# Patient Record
Sex: Female | Born: 1935 | Race: White | Hispanic: No | Marital: Married
Health system: Southern US, Community
[De-identification: ages and names within clinical notes are randomized; demographics above are authoritative.]

## PROBLEM LIST (undated history)

## (undated) DIAGNOSIS — I10 Essential (primary) hypertension: Secondary | ICD-10-CM

---

## 1998-10-09 ENCOUNTER — Ambulatory Visit (HOSPITAL_COMMUNITY): Admission: RE | Admit: 1998-10-09 | Discharge: 1998-10-09 | Payer: Self-pay | Admitting: Gastroenterology

## 2003-10-23 ENCOUNTER — Encounter: Admission: RE | Admit: 2003-10-23 | Discharge: 2003-10-23 | Payer: Self-pay | Admitting: Family Medicine

## 2004-01-11 ENCOUNTER — Encounter: Admission: RE | Admit: 2004-01-11 | Discharge: 2004-01-11 | Payer: Self-pay | Admitting: Family Medicine

## 2004-02-22 ENCOUNTER — Ambulatory Visit (HOSPITAL_COMMUNITY): Admission: RE | Admit: 2004-02-22 | Discharge: 2004-02-22 | Payer: Self-pay | Admitting: Gastroenterology

## 2004-09-17 ENCOUNTER — Encounter: Admission: RE | Admit: 2004-09-17 | Discharge: 2004-09-17 | Payer: Self-pay | Admitting: Family Medicine

## 2004-09-18 ENCOUNTER — Encounter: Admission: RE | Admit: 2004-09-18 | Discharge: 2004-09-18 | Payer: Self-pay | Admitting: Family Medicine

## 2005-10-01 ENCOUNTER — Emergency Department: Payer: Self-pay | Admitting: Emergency Medicine

## 2006-08-14 ENCOUNTER — Encounter: Admission: RE | Admit: 2006-08-14 | Discharge: 2006-08-14 | Payer: Self-pay | Admitting: Family Medicine

## 2007-05-17 ENCOUNTER — Encounter: Admission: RE | Admit: 2007-05-17 | Discharge: 2007-05-17 | Payer: Self-pay | Admitting: Family Medicine

## 2007-11-09 ENCOUNTER — Encounter: Admission: RE | Admit: 2007-11-09 | Discharge: 2007-11-09 | Payer: Self-pay | Admitting: Urology

## 2007-11-11 ENCOUNTER — Ambulatory Visit (HOSPITAL_BASED_OUTPATIENT_CLINIC_OR_DEPARTMENT_OTHER): Admission: RE | Admit: 2007-11-11 | Discharge: 2007-11-11 | Payer: Self-pay | Admitting: Urology

## 2008-08-29 ENCOUNTER — Ambulatory Visit (HOSPITAL_BASED_OUTPATIENT_CLINIC_OR_DEPARTMENT_OTHER): Admission: RE | Admit: 2008-08-29 | Discharge: 2008-08-29 | Payer: Self-pay | Admitting: Urology

## 2010-06-10 ENCOUNTER — Emergency Department (HOSPITAL_COMMUNITY)
Admission: EM | Admit: 2010-06-10 | Discharge: 2010-06-10 | Payer: Self-pay | Source: Home / Self Care | Admitting: Emergency Medicine

## 2010-09-26 LAB — BASIC METABOLIC PANEL
CO2: 25 mEq/L (ref 19–32)
Calcium: 9.1 mg/dL (ref 8.4–10.5)
Chloride: 105 mEq/L (ref 96–112)
Glucose, Bld: 108 mg/dL — ABNORMAL HIGH (ref 70–99)
Sodium: 140 mEq/L (ref 135–145)

## 2010-09-26 LAB — ABO/RH: ABO/RH(D): O POS

## 2010-09-26 LAB — CBC
Hemoglobin: 13 g/dL (ref 12.0–15.0)
MCHC: 34.2 g/dL (ref 30.0–36.0)
MCV: 96.1 fL (ref 78.0–100.0)

## 2010-09-26 LAB — APTT: aPTT: 26 seconds (ref 24–37)

## 2010-10-29 NOTE — Op Note (Signed)
Cynthia Tanner, KOFOED NO.:  [id number]   MEDICAL RECORD NO.:  [id number]          PATIENT TYPE:  AMB   LOCATION:  [facility]                         FACILITY:  [facility]   PHYSICIAN:  Martina Sinner, MD DATE OF BIRTH:  [birth year]   DATE OF PROCEDURE:  08/29/2008  DATE OF DISCHARGE:                               OPERATIVE REPORT   PREOPERATIVE DIAGNOSIS:  Stress urinary incontinence.   POSTOPERATIVE DIAGNOSIS:  Stress urinary incontinence.   SURGERY:  Sling cystourethropexy.   [name] has mixed incontinence but also stress incontinence.  She was  dry after 1 collagen.  Her symptoms recurred and she wished to have a  sling.   The patient was prepped and draped in the usual fashion.  She was given  preoperative antibiotics.  Preoperative laboratory test were normal.  Leg positioning was good to minimize the risk of compartment syndrome,  neuropathy and DVT.   I made two 1-cm incisions 1 fingerbreadth above the symphysis pubis 1.5  cm lateral to the midline.  I marked a 2-cm incision underlying the mid  urethra and instilled 7 mL of lidocaine/epinephrine mixture.  I made an  appropriately deep incision and dissected sharply to the urethrovesical  angle bilaterally.  She had healthy urethra palpably and visibly.   With the bladder empty, I passed a SPARK needle on top, along the back  of the symphysis pubis parallel to the midline and on the pulp of my  index finger bilaterally.   I cystoscoped the patient.  There was no indentation of the bladder with  deflection of the trocar.  There was no injury to the bladder or ureter  or urethra.  There were good blue jets bilaterally.   With the bladder emptied, I attached the SPARK sling and brought it up  to the retropubic space.  I tensioned over the fat part of a moderate-  sized Kelly clamp.  I cut below the blue dots and removed the sheath.  There was appropriate hypermobility and lack of spring-back effect.  I  was happy with the tension position of the sling.   After irrigation, I closed the anterior vaginal wall incision with  running 2-0 Vicryl, followed by 2 interrupted sutures.  I cut the sling  below the skin level and closed with one 4-0 Vicryl, followed by  Dermabond.   She was oozing some during the case.  Blood loss was less than 100 mL.  A firm vaginal pack was inserted with Estrace cream.  Hopefully this  procedure will reach [name]'s treatment goal.           ______________________________  Martina Sinner, MD  Electronically Signed     SAM/MEDQ  D:  08/29/2008  T:  08/29/2008  Job:  [id number]

## 2010-10-29 NOTE — Op Note (Signed)
Cynthia Tanner, DEGRAFFENREID NO.:  [id number]   MEDICAL RECORD NO.:  [id number]          PATIENT TYPE:  AMB   LOCATION:  [facility]                         FACILITY:  [facility]   PHYSICIAN:  Martina Sinner, MD DATE OF BIRTH:  [birth year]   DATE OF PROCEDURE:  11/11/2007  DATE OF DISCHARGE:                               OPERATIVE REPORT   PREOPERATIVE DIAGNOSES:  Stress urinary continence.   POSTOPERATIVE DIAGNOSES:  Stress urinary continence.   SURGERY:  Transurethral cystoscopy and transurethral collagen injection  therapy on Nov 11, 2007.   [name] has mixed stress urge incontinence.  She is 50% better or  greater with Enablex.  Gala Murdoch did not help any more, so she went back to  Enablex.  She was a bit frightened to have surgery and chose collagen  over a sling.   DESCRIPTION OF PROCEDURE:  The patient was prepped and draped in the  usual fashion.  The ACMI injection scope was utilized for the  examination.  Bladder mucosa and trigone were normal.  There is no  stitch, foreign body or carcinoma.  She had hypermobility of the bladder  neck.  Surprisingly, her urethra was somewhat short, approximately 2-2.5  cm in length, making the injection a little bit more challenging.  At 5  o'clock, there is a little bit of a perforation into the neck of the  bladder.  I had a very good coaptation at 7 o'clock.  I used a total of  five syringes.  I put a fifth syringe of collagen near 12 o'clock to try  to make the sphincter even tighter, recognizing the 5 o'clock injection  was not as successful as I would like.  She did have some extravasation  of collagen from the 7 o'clock injection site since it was difficult to  tunnel the needle for as long of a distance as I would like.   She has multiple allergies.  I send her home with Vicodin and  ciprofloxacin.  Based upon the findings today and depending upon her  outcome, it may be more favorable to proceed with a sling in the  future.  He is going to stay off Enablex initially to see how the collagen works.  She will go back on Enablex if she urgency, urge incontinence and  frequency.           ______________________________  Martina Sinner, MD  Electronically Signed     SAM/MEDQ  D:  11/11/2007  T:  11/11/2007  Job:  [id number]

## 2010-11-01 NOTE — Op Note (Signed)
NAMEKYISHA, FOWLE                          ACCOUNT NO.:  [id number]   MEDICAL RECORD NO.:  [id number]                   PATIENT TYPE:  AMB   LOCATION:  ENDO                                 FACILITY:  [facility]   PHYSICIAN:  James L. Malon Kindle., M.D.          DATE OF BIRTH:  December 09, 1934   DATE OF PROCEDURE:  02/22/2004  DATE OF DISCHARGE:                                 OPERATIVE REPORT   PROCEDURE PERFORMED:  Colonoscopy.   ENDOSCOPIST:  Llana Aliment. Edwards, M.D.   MEDICATIONS:  Fentanyl 70 mcg, Versed 7 mg IV.   INDICATIONS FOR PROCEDURE:  Rectal bleeding.   DESCRIPTION OF PROCEDURE:  The procedure had been explained to the patient  and consent obtained.  With the patient in the left lateral decubitus  position, the pediatric adjustable Olympus colonoscope was inserted and  advanced.  The prep was excellent.  We were able to reach the cecum without  difficulty.  The ileocecal valve and appendiceal orifice were seen.  The  scope was withdrawn and the cecum, ascending colon, transverse colon,  splenic flexure, descending and sigmoid colon were seen well.  Scattered  diverticulosis in the sigmoid colon, no other abnormalities.  The rectum was  free of polyps.  In the retroflex view, the patient was seen to have  internal hemorrhoids of a moderate nature.  The scope was withdrawn.  The  patient tolerated the procedure well.   ASSESSMENT:  1.  Rectal bleeding, probably internal hemorrhoids, 5781.  2.  Moderate diverticulosis, 56210.   PLAN:  Will recommend yearly Hemoccults, will give a hemorrhoid instruction  sheet and see her back on an as needed basis.                                               James L. Malon Kindle., M.D.    Waldron Session  D:  02/22/2004  T:  02/22/2004  Job:  [id number]   cc:   Donia Guiles, M.D.  [address] [place]  [place] [address]  Fax: [phone]

## 2011-03-12 LAB — POCT I-STAT 4, (NA,K, GLUC, HGB,HCT)
Glucose, Bld: 92
HCT: 39
Hemoglobin: 13.3
Operator id: 280881
Potassium: 4.8

## 2012-06-21 ENCOUNTER — Other Ambulatory Visit: Payer: Self-pay | Admitting: Family Medicine

## 2012-06-21 DIAGNOSIS — M25511 Pain in right shoulder: Secondary | ICD-10-CM

## 2012-06-25 ENCOUNTER — Ambulatory Visit
Admission: RE | Admit: 2012-06-25 | Discharge: 2012-06-25 | Disposition: A | Discharge status: Home / Self Care | Payer: Medicare Other | Source: Ambulatory Visit | Attending: Family Medicine | Admitting: Family Medicine

## 2012-06-25 DIAGNOSIS — M25511 Pain in right shoulder: Secondary | ICD-10-CM

## 2013-01-23 ENCOUNTER — Emergency Department: Payer: Self-pay | Admitting: Emergency Medicine

## 2013-02-11 ENCOUNTER — Emergency Department: Payer: Self-pay | Admitting: Emergency Medicine

## 2014-09-14 ENCOUNTER — Other Ambulatory Visit: Payer: Self-pay | Admitting: Family Medicine

## 2014-09-14 DIAGNOSIS — R109 Unspecified abdominal pain: Secondary | ICD-10-CM

## 2014-09-21 ENCOUNTER — Ambulatory Visit
Admission: RE | Admit: 2014-09-21 | Discharge: 2014-09-21 | Disposition: A | Discharge status: Home / Self Care | Payer: Medicare Other | Source: Ambulatory Visit | Attending: Family Medicine | Admitting: Family Medicine

## 2014-09-21 DIAGNOSIS — R109 Unspecified abdominal pain: Secondary | ICD-10-CM

## 2015-06-19 DIAGNOSIS — M5432 Sciatica, left side: Secondary | ICD-10-CM | POA: Diagnosis not present

## 2015-06-19 DIAGNOSIS — M9903 Segmental and somatic dysfunction of lumbar region: Secondary | ICD-10-CM | POA: Diagnosis not present

## 2015-07-02 DIAGNOSIS — Z1231 Encounter for screening mammogram for malignant neoplasm of breast: Secondary | ICD-10-CM | POA: Diagnosis not present

## 2016-01-11 DIAGNOSIS — F411 Generalized anxiety disorder: Secondary | ICD-10-CM | POA: Diagnosis not present

## 2016-01-11 DIAGNOSIS — I10 Essential (primary) hypertension: Secondary | ICD-10-CM | POA: Diagnosis not present

## 2016-01-11 DIAGNOSIS — K219 Gastro-esophageal reflux disease without esophagitis: Secondary | ICD-10-CM | POA: Diagnosis not present

## 2016-01-11 DIAGNOSIS — Z0000 Encounter for general adult medical examination without abnormal findings: Secondary | ICD-10-CM | POA: Diagnosis not present

## 2016-01-11 DIAGNOSIS — M1990 Unspecified osteoarthritis, unspecified site: Secondary | ICD-10-CM | POA: Diagnosis not present

## 2016-01-11 DIAGNOSIS — J301 Allergic rhinitis due to pollen: Secondary | ICD-10-CM | POA: Diagnosis not present

## 2016-01-11 DIAGNOSIS — M8580 Other specified disorders of bone density and structure, unspecified site: Secondary | ICD-10-CM | POA: Diagnosis not present

## 2016-01-11 DIAGNOSIS — E669 Obesity, unspecified: Secondary | ICD-10-CM | POA: Diagnosis not present

## 2016-01-11 DIAGNOSIS — R7309 Other abnormal glucose: Secondary | ICD-10-CM | POA: Diagnosis not present

## 2016-01-11 DIAGNOSIS — E782 Mixed hyperlipidemia: Secondary | ICD-10-CM | POA: Diagnosis not present

## 2016-04-01 DIAGNOSIS — H04123 Dry eye syndrome of bilateral lacrimal glands: Secondary | ICD-10-CM | POA: Diagnosis not present

## 2016-04-01 DIAGNOSIS — Z961 Presence of intraocular lens: Secondary | ICD-10-CM | POA: Diagnosis not present

## 2016-04-01 DIAGNOSIS — H278 Other specified disorders of lens: Secondary | ICD-10-CM | POA: Diagnosis not present

## 2016-04-01 DIAGNOSIS — H26493 Other secondary cataract, bilateral: Secondary | ICD-10-CM | POA: Diagnosis not present

## 2016-04-10 DIAGNOSIS — H26493 Other secondary cataract, bilateral: Secondary | ICD-10-CM | POA: Diagnosis not present

## 2016-04-17 DIAGNOSIS — Z961 Presence of intraocular lens: Secondary | ICD-10-CM | POA: Diagnosis not present

## 2016-04-17 DIAGNOSIS — H26491 Other secondary cataract, right eye: Secondary | ICD-10-CM | POA: Diagnosis not present

## 2016-05-14 DIAGNOSIS — M9903 Segmental and somatic dysfunction of lumbar region: Secondary | ICD-10-CM | POA: Diagnosis not present

## 2016-05-14 DIAGNOSIS — M5441 Lumbago with sciatica, right side: Secondary | ICD-10-CM | POA: Diagnosis not present

## 2016-05-26 DIAGNOSIS — M5441 Lumbago with sciatica, right side: Secondary | ICD-10-CM | POA: Diagnosis not present

## 2016-05-26 DIAGNOSIS — M9903 Segmental and somatic dysfunction of lumbar region: Secondary | ICD-10-CM | POA: Diagnosis not present

## 2016-06-25 DIAGNOSIS — M9903 Segmental and somatic dysfunction of lumbar region: Secondary | ICD-10-CM | POA: Diagnosis not present

## 2016-06-25 DIAGNOSIS — M5441 Lumbago with sciatica, right side: Secondary | ICD-10-CM | POA: Diagnosis not present

## 2016-06-25 DIAGNOSIS — M7611 Psoas tendinitis, right hip: Secondary | ICD-10-CM | POA: Diagnosis not present

## 2016-06-30 DIAGNOSIS — M7611 Psoas tendinitis, right hip: Secondary | ICD-10-CM | POA: Diagnosis not present

## 2016-06-30 DIAGNOSIS — M9903 Segmental and somatic dysfunction of lumbar region: Secondary | ICD-10-CM | POA: Diagnosis not present

## 2016-06-30 DIAGNOSIS — M5441 Lumbago with sciatica, right side: Secondary | ICD-10-CM | POA: Diagnosis not present

## 2016-07-09 DIAGNOSIS — Z1231 Encounter for screening mammogram for malignant neoplasm of breast: Secondary | ICD-10-CM | POA: Diagnosis not present

## 2016-07-09 DIAGNOSIS — N760 Acute vaginitis: Secondary | ICD-10-CM | POA: Diagnosis not present

## 2016-07-11 DIAGNOSIS — R922 Inconclusive mammogram: Secondary | ICD-10-CM | POA: Diagnosis not present

## 2016-07-14 DIAGNOSIS — I10 Essential (primary) hypertension: Secondary | ICD-10-CM | POA: Diagnosis not present

## 2016-07-14 DIAGNOSIS — E782 Mixed hyperlipidemia: Secondary | ICD-10-CM | POA: Diagnosis not present

## 2016-07-14 DIAGNOSIS — E669 Obesity, unspecified: Secondary | ICD-10-CM | POA: Diagnosis not present

## 2016-07-14 DIAGNOSIS — R7301 Impaired fasting glucose: Secondary | ICD-10-CM | POA: Diagnosis not present

## 2016-07-14 DIAGNOSIS — Z6832 Body mass index (BMI) 32.0-32.9, adult: Secondary | ICD-10-CM | POA: Diagnosis not present

## 2016-07-14 DIAGNOSIS — N760 Acute vaginitis: Secondary | ICD-10-CM | POA: Diagnosis not present

## 2016-08-26 DIAGNOSIS — N760 Acute vaginitis: Secondary | ICD-10-CM | POA: Diagnosis not present

## 2016-09-19 DIAGNOSIS — N760 Acute vaginitis: Secondary | ICD-10-CM | POA: Diagnosis not present

## 2016-09-26 DIAGNOSIS — N761 Subacute and chronic vaginitis: Secondary | ICD-10-CM | POA: Diagnosis not present

## 2016-12-12 DIAGNOSIS — M5441 Lumbago with sciatica, right side: Secondary | ICD-10-CM | POA: Diagnosis not present

## 2016-12-12 DIAGNOSIS — M9903 Segmental and somatic dysfunction of lumbar region: Secondary | ICD-10-CM | POA: Diagnosis not present

## 2017-02-03 DIAGNOSIS — Z0000 Encounter for general adult medical examination without abnormal findings: Secondary | ICD-10-CM | POA: Diagnosis not present

## 2017-02-03 DIAGNOSIS — M8580 Other specified disorders of bone density and structure, unspecified site: Secondary | ICD-10-CM | POA: Diagnosis not present

## 2017-02-03 DIAGNOSIS — R5383 Other fatigue: Secondary | ICD-10-CM | POA: Diagnosis not present

## 2017-02-03 DIAGNOSIS — E669 Obesity, unspecified: Secondary | ICD-10-CM | POA: Diagnosis not present

## 2017-02-03 DIAGNOSIS — E782 Mixed hyperlipidemia: Secondary | ICD-10-CM | POA: Diagnosis not present

## 2017-02-03 DIAGNOSIS — J301 Allergic rhinitis due to pollen: Secondary | ICD-10-CM | POA: Diagnosis not present

## 2017-02-03 DIAGNOSIS — I10 Essential (primary) hypertension: Secondary | ICD-10-CM | POA: Diagnosis not present

## 2017-02-03 DIAGNOSIS — F411 Generalized anxiety disorder: Secondary | ICD-10-CM | POA: Diagnosis not present

## 2017-02-03 DIAGNOSIS — M1990 Unspecified osteoarthritis, unspecified site: Secondary | ICD-10-CM | POA: Diagnosis not present

## 2017-02-03 DIAGNOSIS — R7309 Other abnormal glucose: Secondary | ICD-10-CM | POA: Diagnosis not present

## 2017-02-03 DIAGNOSIS — K219 Gastro-esophageal reflux disease without esophagitis: Secondary | ICD-10-CM | POA: Diagnosis not present

## 2017-03-17 DIAGNOSIS — M8588 Other specified disorders of bone density and structure, other site: Secondary | ICD-10-CM | POA: Diagnosis not present

## 2017-03-31 DIAGNOSIS — M810 Age-related osteoporosis without current pathological fracture: Secondary | ICD-10-CM | POA: Diagnosis not present

## 2017-07-14 DIAGNOSIS — H04123 Dry eye syndrome of bilateral lacrimal glands: Secondary | ICD-10-CM | POA: Diagnosis not present

## 2017-07-14 DIAGNOSIS — Z961 Presence of intraocular lens: Secondary | ICD-10-CM | POA: Diagnosis not present

## 2017-07-14 DIAGNOSIS — H43813 Vitreous degeneration, bilateral: Secondary | ICD-10-CM | POA: Diagnosis not present

## 2017-07-15 DIAGNOSIS — Z1231 Encounter for screening mammogram for malignant neoplasm of breast: Secondary | ICD-10-CM | POA: Diagnosis not present

## 2017-08-05 DIAGNOSIS — N3281 Overactive bladder: Secondary | ICD-10-CM | POA: Diagnosis not present

## 2017-08-05 DIAGNOSIS — R7309 Other abnormal glucose: Secondary | ICD-10-CM | POA: Diagnosis not present

## 2017-08-05 DIAGNOSIS — Z6834 Body mass index (BMI) 34.0-34.9, adult: Secondary | ICD-10-CM | POA: Diagnosis not present

## 2017-08-05 DIAGNOSIS — E782 Mixed hyperlipidemia: Secondary | ICD-10-CM | POA: Diagnosis not present

## 2017-08-05 DIAGNOSIS — I10 Essential (primary) hypertension: Secondary | ICD-10-CM | POA: Diagnosis not present

## 2017-08-05 DIAGNOSIS — E669 Obesity, unspecified: Secondary | ICD-10-CM | POA: Diagnosis not present

## 2017-10-08 DIAGNOSIS — M810 Age-related osteoporosis without current pathological fracture: Secondary | ICD-10-CM | POA: Diagnosis not present

## 2017-12-14 DIAGNOSIS — K131 Cheek and lip biting: Secondary | ICD-10-CM | POA: Diagnosis not present

## 2017-12-31 DIAGNOSIS — N3946 Mixed incontinence: Secondary | ICD-10-CM | POA: Diagnosis not present

## 2017-12-31 DIAGNOSIS — F980 Enuresis not due to a substance or known physiological condition: Secondary | ICD-10-CM | POA: Diagnosis not present

## 2018-01-06 DIAGNOSIS — N3946 Mixed incontinence: Secondary | ICD-10-CM | POA: Diagnosis not present

## 2018-01-06 DIAGNOSIS — R351 Nocturia: Secondary | ICD-10-CM | POA: Diagnosis not present

## 2018-02-04 DIAGNOSIS — F980 Enuresis not due to a substance or known physiological condition: Secondary | ICD-10-CM | POA: Diagnosis not present

## 2018-02-04 DIAGNOSIS — N3946 Mixed incontinence: Secondary | ICD-10-CM | POA: Diagnosis not present

## 2018-02-10 ENCOUNTER — Other Ambulatory Visit: Payer: Self-pay | Admitting: Family Medicine

## 2018-02-10 DIAGNOSIS — E782 Mixed hyperlipidemia: Secondary | ICD-10-CM | POA: Diagnosis not present

## 2018-02-10 DIAGNOSIS — J301 Allergic rhinitis due to pollen: Secondary | ICD-10-CM | POA: Diagnosis not present

## 2018-02-10 DIAGNOSIS — M810 Age-related osteoporosis without current pathological fracture: Secondary | ICD-10-CM | POA: Diagnosis not present

## 2018-02-10 DIAGNOSIS — I10 Essential (primary) hypertension: Secondary | ICD-10-CM | POA: Diagnosis not present

## 2018-02-10 DIAGNOSIS — F411 Generalized anxiety disorder: Secondary | ICD-10-CM | POA: Diagnosis not present

## 2018-02-10 DIAGNOSIS — Z23 Encounter for immunization: Secondary | ICD-10-CM | POA: Diagnosis not present

## 2018-02-10 DIAGNOSIS — M1990 Unspecified osteoarthritis, unspecified site: Secondary | ICD-10-CM | POA: Diagnosis not present

## 2018-02-10 DIAGNOSIS — R1010 Upper abdominal pain, unspecified: Secondary | ICD-10-CM | POA: Diagnosis not present

## 2018-02-10 DIAGNOSIS — R7309 Other abnormal glucose: Secondary | ICD-10-CM | POA: Diagnosis not present

## 2018-02-10 DIAGNOSIS — K219 Gastro-esophageal reflux disease without esophagitis: Secondary | ICD-10-CM | POA: Diagnosis not present

## 2018-02-10 DIAGNOSIS — Z0000 Encounter for general adult medical examination without abnormal findings: Secondary | ICD-10-CM | POA: Diagnosis not present

## 2018-02-19 ENCOUNTER — Ambulatory Visit
Admission: RE | Admit: 2018-02-19 | Discharge: 2018-02-19 | Disposition: A | Discharge status: Home / Self Care | Payer: PPO | Source: Ambulatory Visit | Attending: Family Medicine | Admitting: Family Medicine

## 2018-02-19 DIAGNOSIS — R1010 Upper abdominal pain, unspecified: Secondary | ICD-10-CM

## 2018-02-19 DIAGNOSIS — D171 Benign lipomatous neoplasm of skin and subcutaneous tissue of trunk: Secondary | ICD-10-CM | POA: Diagnosis not present

## 2018-02-19 DIAGNOSIS — K449 Diaphragmatic hernia without obstruction or gangrene: Secondary | ICD-10-CM | POA: Diagnosis not present

## 2018-02-19 MED ORDER — IOPAMIDOL (ISOVUE-300) INJECTION 61%
100.0000 mL | Freq: Once | INTRAVENOUS | Status: AC | PRN
  Administered 2018-02-19: 100 mL via INTRAVENOUS

## 2018-02-27 DIAGNOSIS — R21 Rash and other nonspecific skin eruption: Secondary | ICD-10-CM | POA: Diagnosis not present

## 2018-03-17 DIAGNOSIS — F980 Enuresis not due to a substance or known physiological condition: Secondary | ICD-10-CM | POA: Diagnosis not present

## 2018-03-17 DIAGNOSIS — N3946 Mixed incontinence: Secondary | ICD-10-CM | POA: Diagnosis not present

## 2018-04-12 DIAGNOSIS — M810 Age-related osteoporosis without current pathological fracture: Secondary | ICD-10-CM | POA: Diagnosis not present

## 2018-04-30 DIAGNOSIS — R05 Cough: Secondary | ICD-10-CM | POA: Diagnosis not present

## 2018-04-30 DIAGNOSIS — K449 Diaphragmatic hernia without obstruction or gangrene: Secondary | ICD-10-CM | POA: Diagnosis not present

## 2018-07-19 DIAGNOSIS — J069 Acute upper respiratory infection, unspecified: Secondary | ICD-10-CM | POA: Diagnosis not present

## 2018-08-18 ENCOUNTER — Ambulatory Visit
Admission: RE | Admit: 2018-08-18 | Discharge: 2018-08-18 | Disposition: A | Discharge status: Home / Self Care | Payer: PPO | Source: Ambulatory Visit | Attending: Family Medicine | Admitting: Family Medicine

## 2018-08-18 ENCOUNTER — Other Ambulatory Visit: Payer: Self-pay | Admitting: Family Medicine

## 2018-08-18 DIAGNOSIS — L309 Dermatitis, unspecified: Secondary | ICD-10-CM | POA: Diagnosis not present

## 2018-08-18 DIAGNOSIS — R059 Cough, unspecified: Secondary | ICD-10-CM

## 2018-08-18 DIAGNOSIS — R05 Cough: Secondary | ICD-10-CM | POA: Diagnosis not present

## 2018-08-18 DIAGNOSIS — R7309 Other abnormal glucose: Secondary | ICD-10-CM | POA: Diagnosis not present

## 2018-08-18 DIAGNOSIS — I10 Essential (primary) hypertension: Secondary | ICD-10-CM | POA: Diagnosis not present

## 2018-08-18 DIAGNOSIS — Z6834 Body mass index (BMI) 34.0-34.9, adult: Secondary | ICD-10-CM | POA: Diagnosis not present

## 2018-08-18 DIAGNOSIS — E669 Obesity, unspecified: Secondary | ICD-10-CM | POA: Diagnosis not present

## 2018-08-18 DIAGNOSIS — N760 Acute vaginitis: Secondary | ICD-10-CM | POA: Diagnosis not present

## 2018-08-18 DIAGNOSIS — R5383 Other fatigue: Secondary | ICD-10-CM | POA: Diagnosis not present

## 2018-08-18 DIAGNOSIS — E782 Mixed hyperlipidemia: Secondary | ICD-10-CM | POA: Diagnosis not present

## 2018-12-01 DIAGNOSIS — L570 Actinic keratosis: Secondary | ICD-10-CM | POA: Diagnosis not present

## 2018-12-01 DIAGNOSIS — L853 Xerosis cutis: Secondary | ICD-10-CM | POA: Diagnosis not present

## 2018-12-01 DIAGNOSIS — D485 Neoplasm of uncertain behavior of skin: Secondary | ICD-10-CM | POA: Diagnosis not present

## 2018-12-01 DIAGNOSIS — C44319 Basal cell carcinoma of skin of other parts of face: Secondary | ICD-10-CM | POA: Diagnosis not present

## 2019-01-04 DIAGNOSIS — Z1231 Encounter for screening mammogram for malignant neoplasm of breast: Secondary | ICD-10-CM | POA: Diagnosis not present

## 2019-01-18 DIAGNOSIS — C44319 Basal cell carcinoma of skin of other parts of face: Secondary | ICD-10-CM | POA: Diagnosis not present

## 2019-01-27 DIAGNOSIS — M810 Age-related osteoporosis without current pathological fracture: Secondary | ICD-10-CM | POA: Diagnosis not present

## 2019-01-28 IMAGING — CT CT ABDOMEN W/ CM
2 of 3 series · 13 of 32 positions shown, 19 images · IV contrast (iopamidol)
Comparison: None.

CLINICAL DATA: Upper abdominal pain for 1 year.

EXAM:
CT ABDOMEN WITH CONTRAST
TECHNIQUE: Multidetector CT imaging of the abdomen was performed using the
standard protocol following bolus administration of intravenous
contrast.
CONTRAST:  100mL 9D7GYK-OTT IOPAMIDOL (9D7GYK-OTT) INJECTION 61%

[Series 2: abdroutine 5.0 i40s 1 · axial · 0.81mm/px · z∈[-305,-80]mm · 9 of 57 slices shown, 15 images]
[im 6/57  soft-tissue]
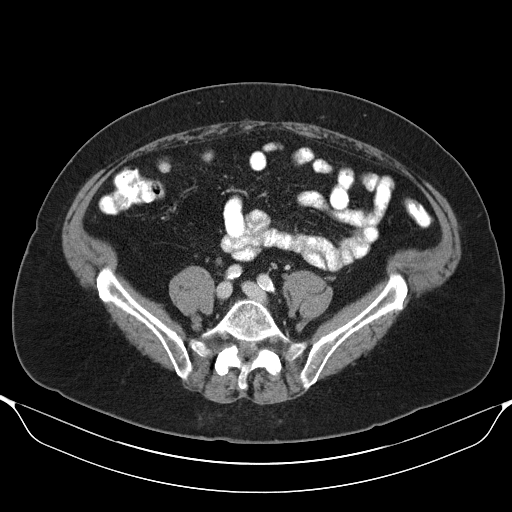
[im 6/57  bone]
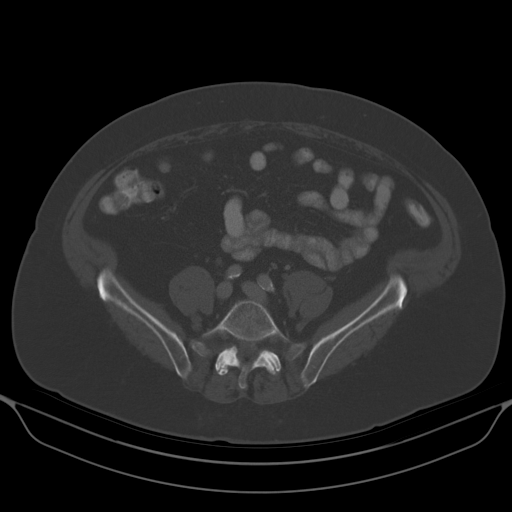
[im 12/57  soft-tissue]
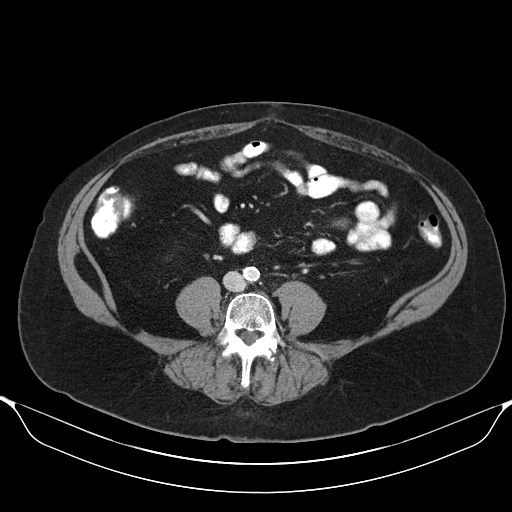
[im 17/57  soft-tissue]
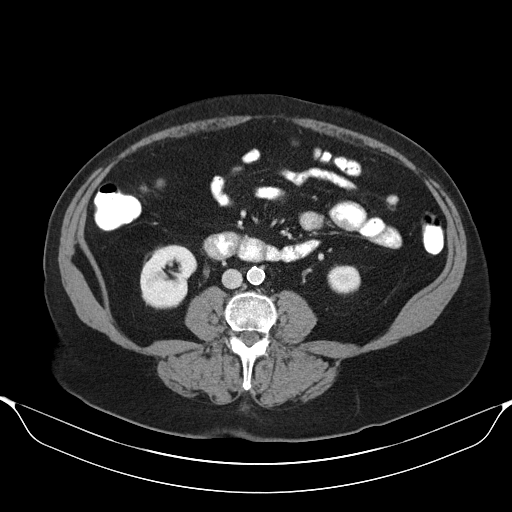
[im 23/57  soft-tissue]
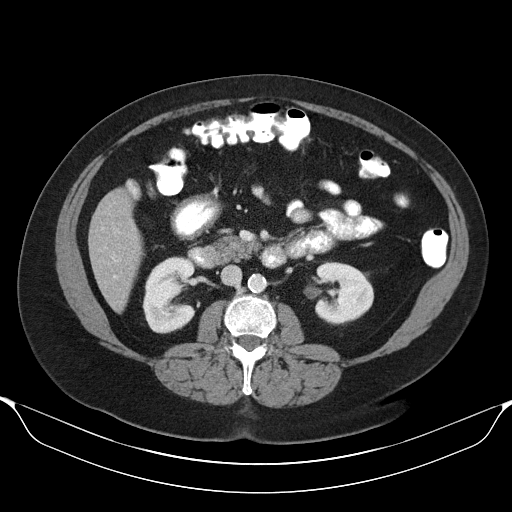
[im 29/57  soft-tissue]
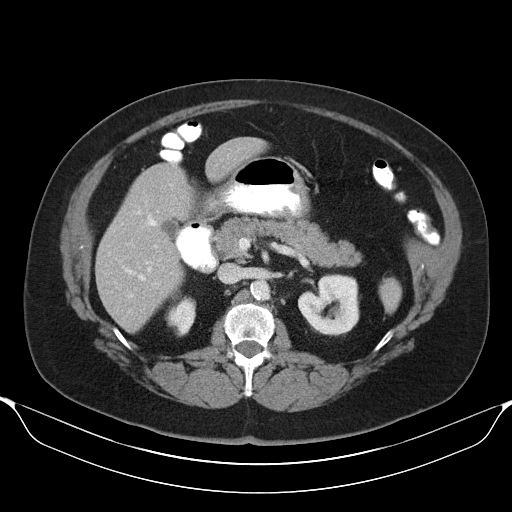
[im 34/57  soft-tissue]
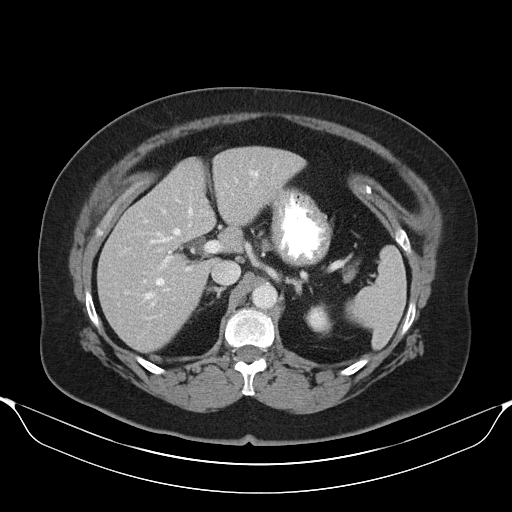
[im 34/57  lung]
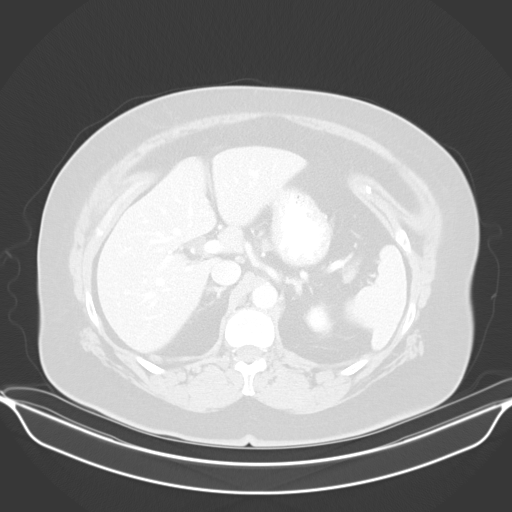
[im 40/57  soft-tissue]
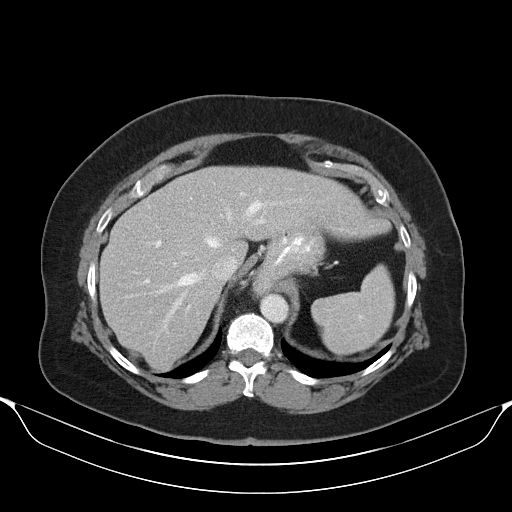
[im 40/57  lung]
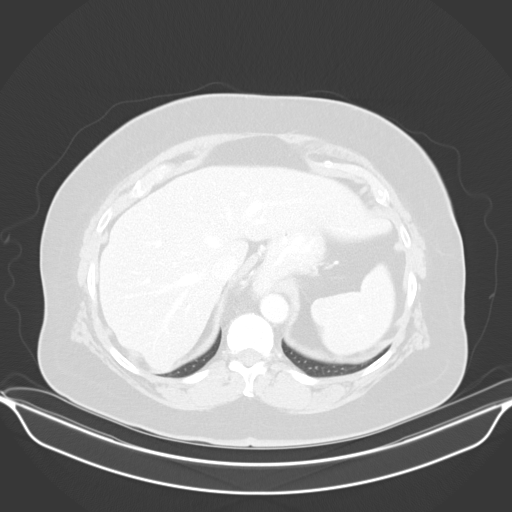
[im 45/57  soft-tissue]
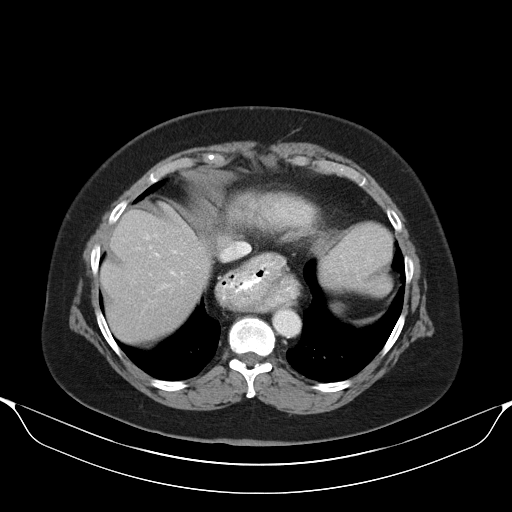
[im 45/57  lung]
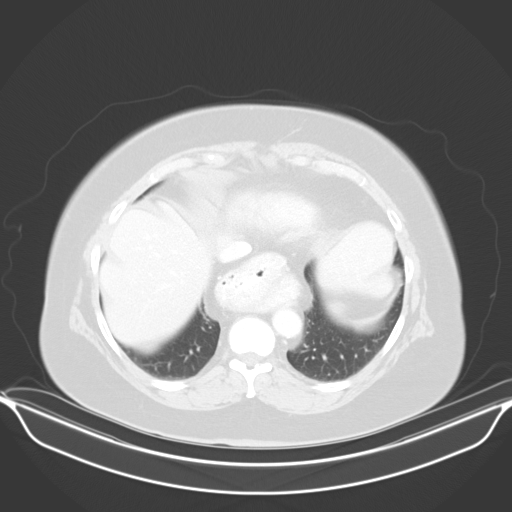
[im 51/57  soft-tissue]
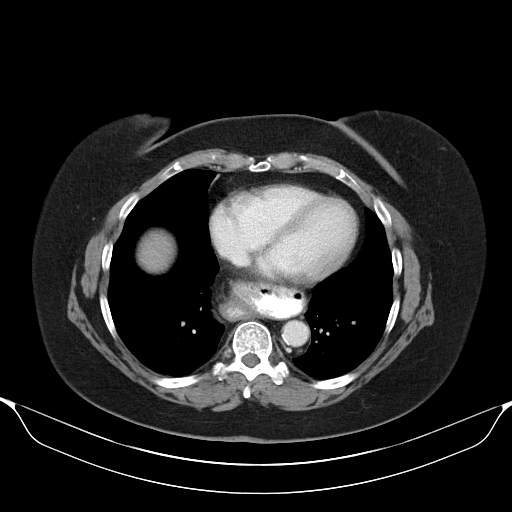
[im 51/57  lung]
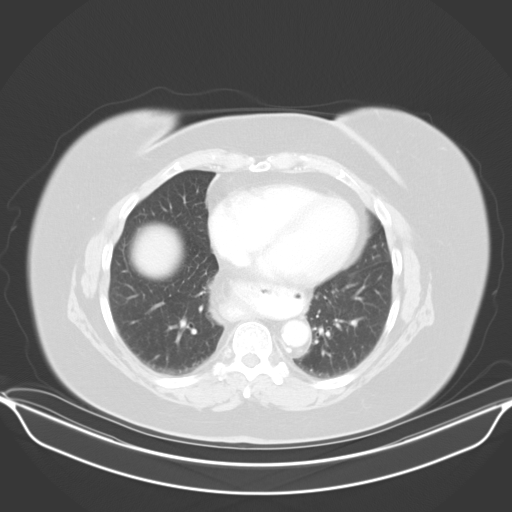
[im 51/57  bone]
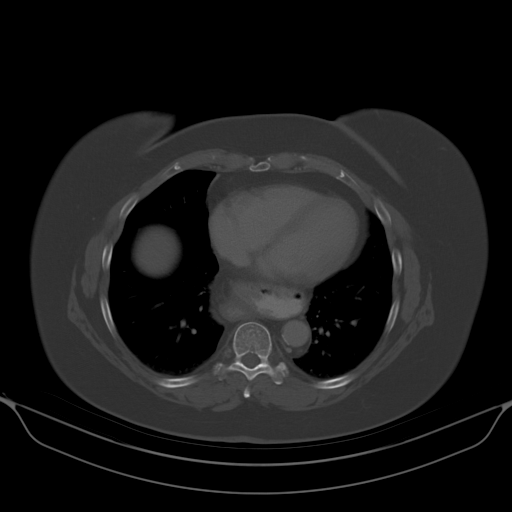

[Series 4: lungs · axial · 0.81mm/px · z∈[-170,-120]mm · 4 of 66 slices shown]
[im 6/66  soft-tissue]
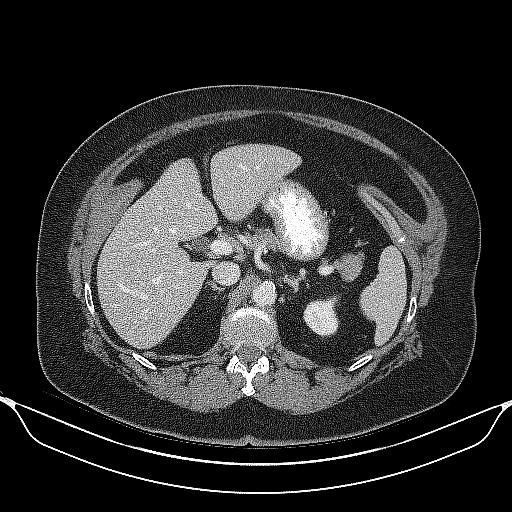
[im 16/66  soft-tissue]
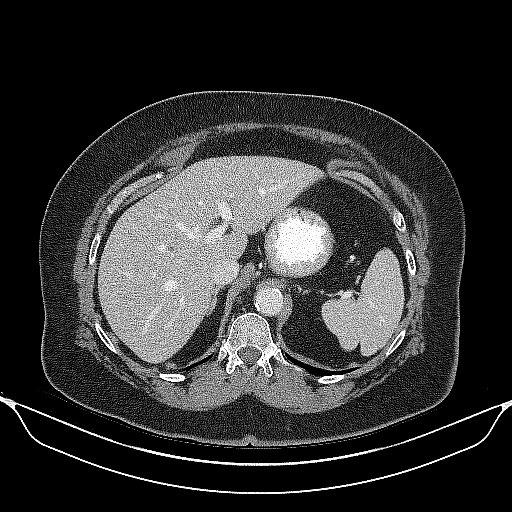
[im 21/66  soft-tissue]
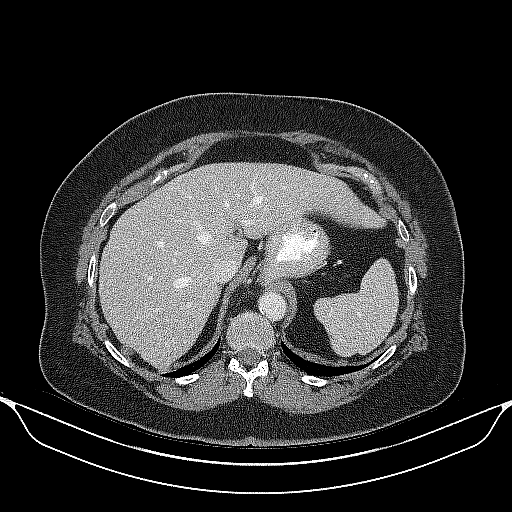
[im 31/66  soft-tissue]
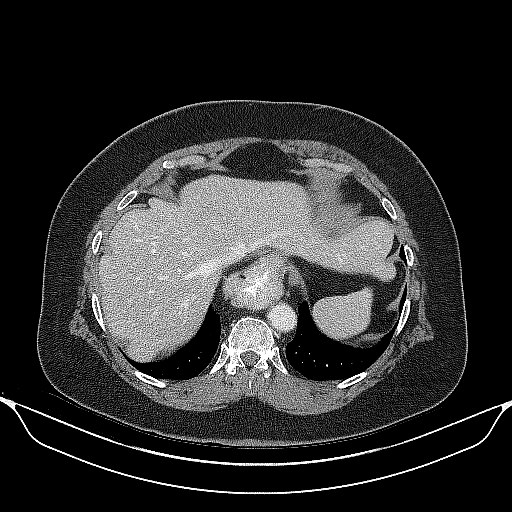

[13 of 32 positions shown; findings below may reference images not displayed]

FINDINGS: Lower chest: No acute findings.

Hepatobiliary: No hepatic masses identified. Gallbladder is
unremarkable.

Pancreas:  No mass or inflammatory changes.

Spleen:  Within normal limits in size and appearance.

Adrenals/Urinary Tract: No masses identified. No evidence of
hydronephrosis.

Stomach/Bowel: Moderate size hiatal hernia is seen. Visualized bowel
loops are unremarkable.

Vascular/Lymphatic: No pathologically enlarged lymph nodes
identified. No abdominal aortic aneurysm. Aortic atherosclerosis.

Other: A small intramuscular lipoma is seen in the right lateral
abdominal wall, which measures 6.6 x 1.8 cm.

Musculoskeletal:  No suspicious bone lesions identified.
IMPRESSION: No acute findings.

Moderate hiatal hernia.

Small right lateral abdominal wall intramuscular lipoma.

## 2019-02-16 DIAGNOSIS — R7309 Other abnormal glucose: Secondary | ICD-10-CM | POA: Diagnosis not present

## 2019-02-16 DIAGNOSIS — I10 Essential (primary) hypertension: Secondary | ICD-10-CM | POA: Diagnosis not present

## 2019-02-16 DIAGNOSIS — E782 Mixed hyperlipidemia: Secondary | ICD-10-CM | POA: Diagnosis not present

## 2019-02-18 DIAGNOSIS — F411 Generalized anxiety disorder: Secondary | ICD-10-CM | POA: Diagnosis not present

## 2019-02-18 DIAGNOSIS — J301 Allergic rhinitis due to pollen: Secondary | ICD-10-CM | POA: Diagnosis not present

## 2019-02-18 DIAGNOSIS — M810 Age-related osteoporosis without current pathological fracture: Secondary | ICD-10-CM | POA: Diagnosis not present

## 2019-02-18 DIAGNOSIS — R05 Cough: Secondary | ICD-10-CM | POA: Diagnosis not present

## 2019-02-18 DIAGNOSIS — E782 Mixed hyperlipidemia: Secondary | ICD-10-CM | POA: Diagnosis not present

## 2019-02-18 DIAGNOSIS — Z0000 Encounter for general adult medical examination without abnormal findings: Secondary | ICD-10-CM | POA: Diagnosis not present

## 2019-02-18 DIAGNOSIS — R7309 Other abnormal glucose: Secondary | ICD-10-CM | POA: Diagnosis not present

## 2019-02-18 DIAGNOSIS — I10 Essential (primary) hypertension: Secondary | ICD-10-CM | POA: Diagnosis not present

## 2019-02-18 DIAGNOSIS — Z7189 Other specified counseling: Secondary | ICD-10-CM | POA: Diagnosis not present

## 2019-02-18 DIAGNOSIS — K219 Gastro-esophageal reflux disease without esophagitis: Secondary | ICD-10-CM | POA: Diagnosis not present

## 2019-02-18 DIAGNOSIS — M1990 Unspecified osteoarthritis, unspecified site: Secondary | ICD-10-CM | POA: Diagnosis not present

## 2019-02-18 DIAGNOSIS — E669 Obesity, unspecified: Secondary | ICD-10-CM | POA: Diagnosis not present

## 2019-04-27 DIAGNOSIS — Z23 Encounter for immunization: Secondary | ICD-10-CM | POA: Diagnosis not present

## 2019-04-27 DIAGNOSIS — L820 Inflamed seborrheic keratosis: Secondary | ICD-10-CM | POA: Diagnosis not present

## 2019-04-27 DIAGNOSIS — L570 Actinic keratosis: Secondary | ICD-10-CM | POA: Diagnosis not present

## 2019-07-27 IMAGING — DX DG CHEST 2V
2 series · 2 of 2 positions shown · non-contrast
Comparison: 11/09/2007

CLINICAL DATA: Cough and congestion

EXAM:
CHEST - 2 VIEW

[dg chest 2 view (1 of 2)]
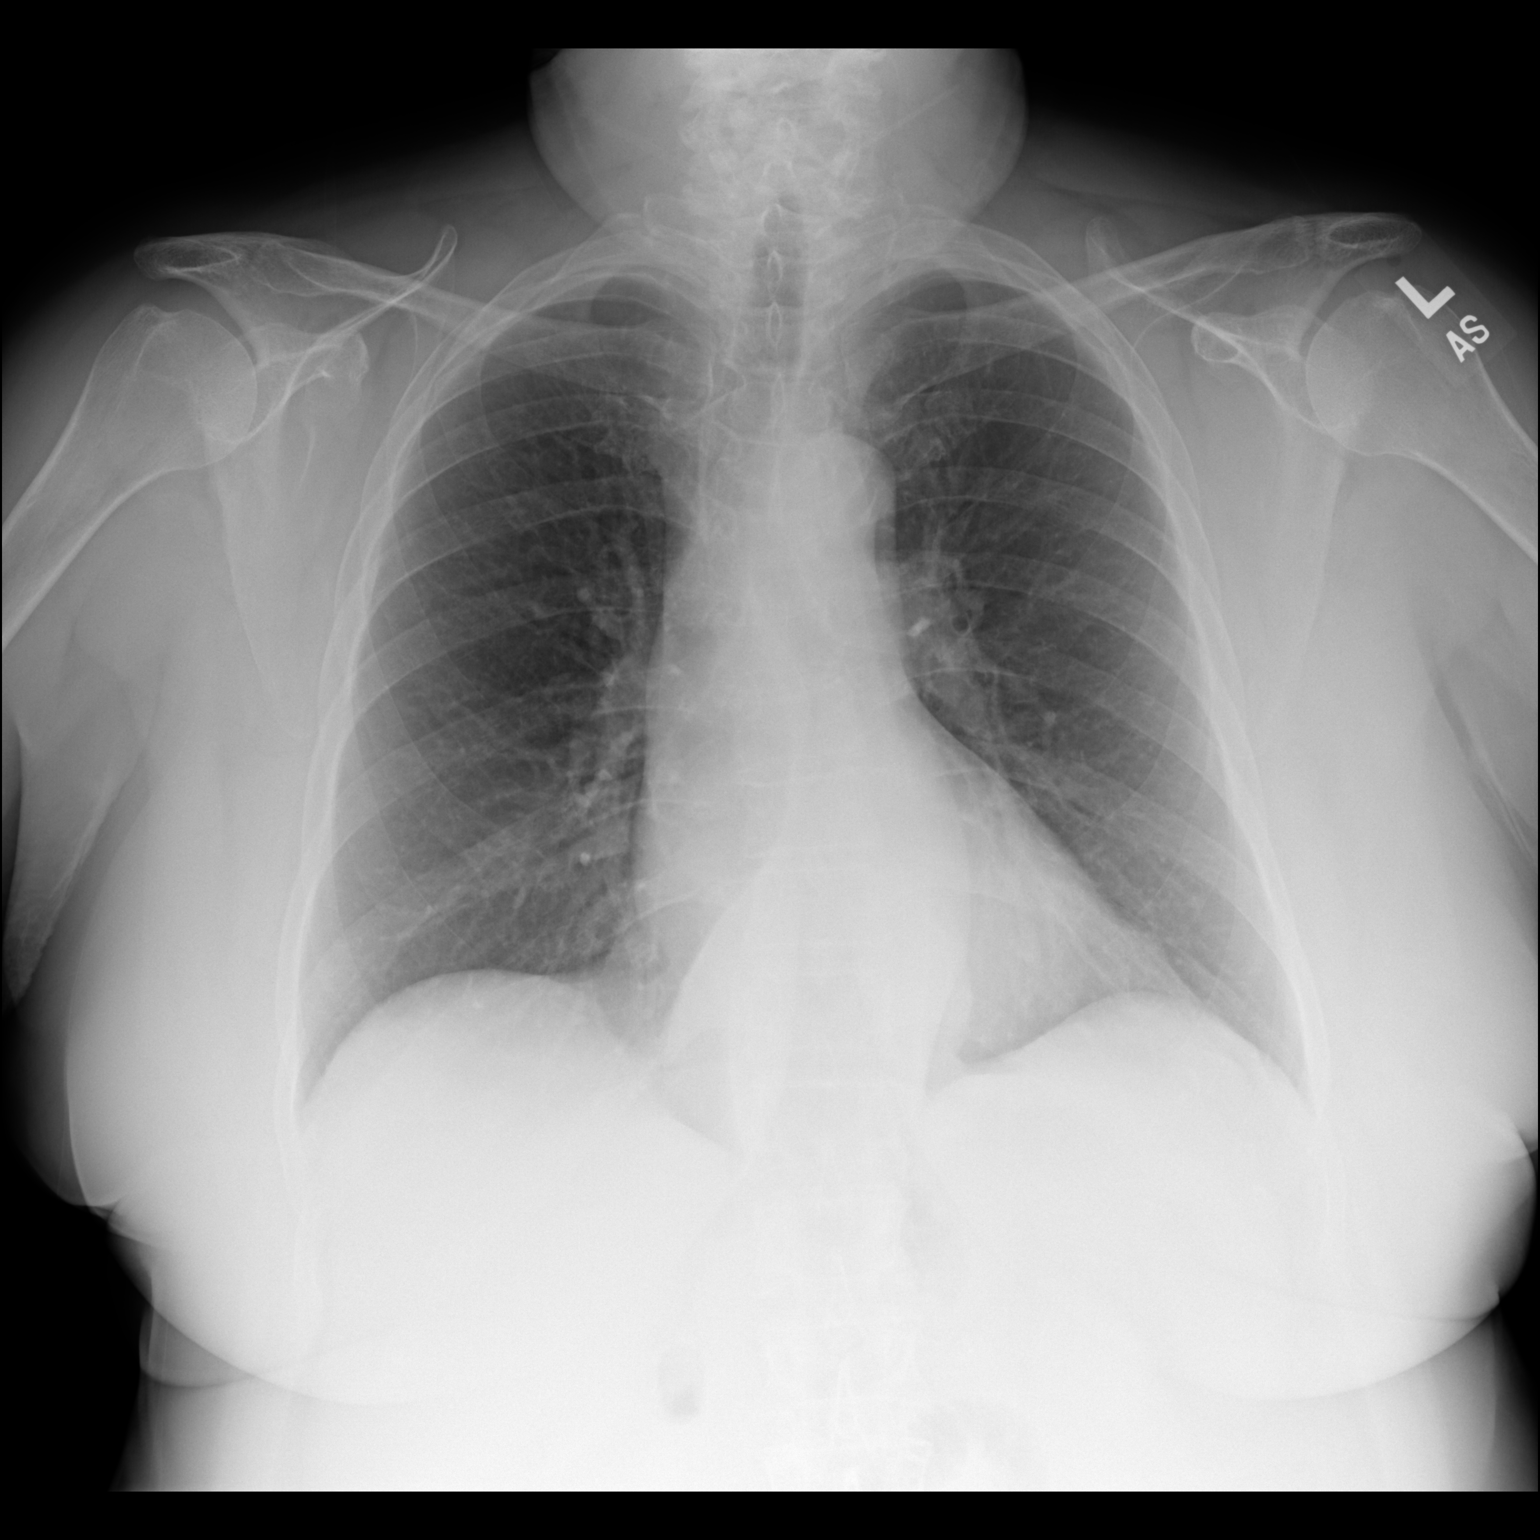

[dg chest 2 view (2 of 2)]
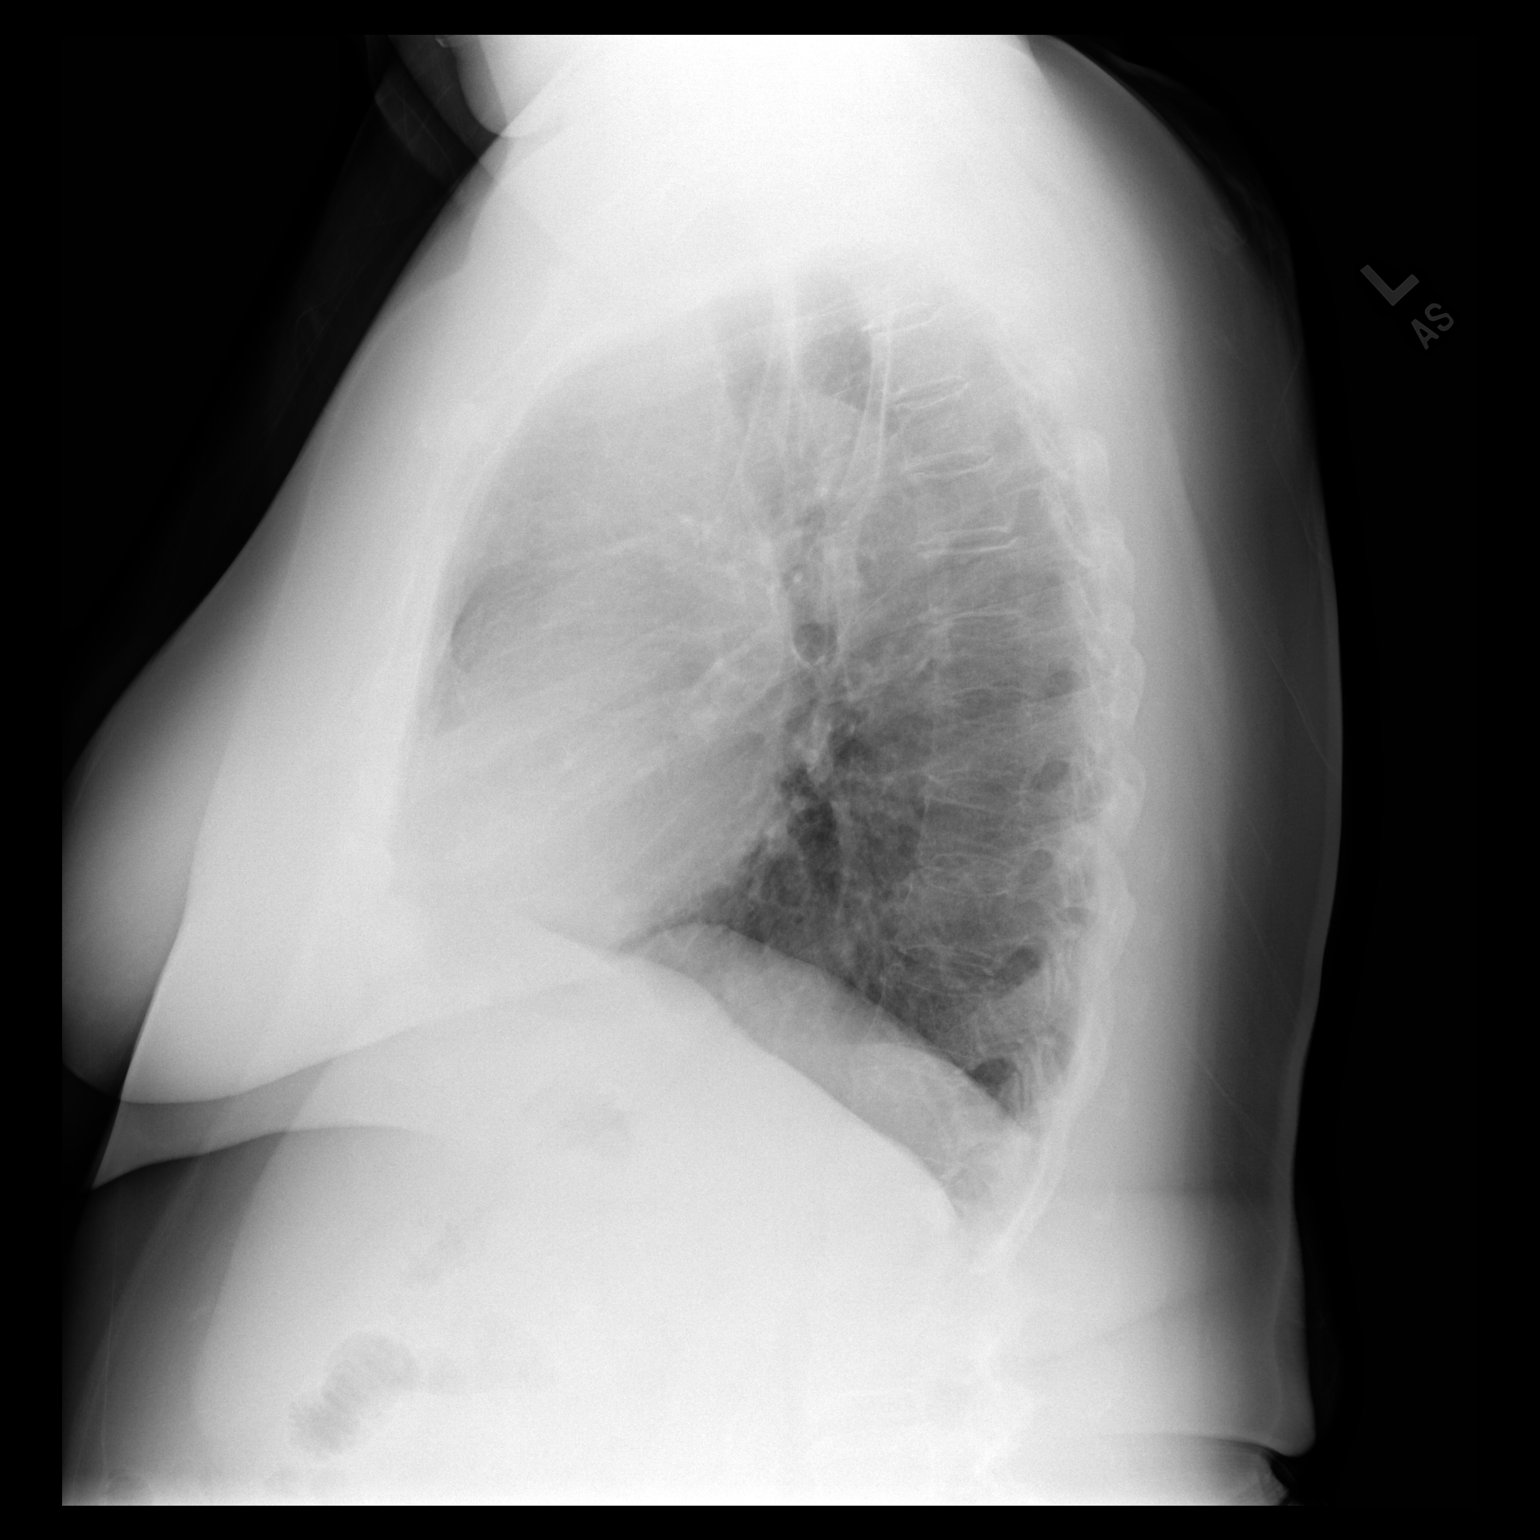

[2 of 2 positions shown; findings below may reference images not displayed]

FINDINGS: The heart size and mediastinal contours are within normal limits.
Both lungs are clear. The visualized skeletal structures are
unremarkable.
IMPRESSION: No active cardiopulmonary disease.

## 2019-08-01 DIAGNOSIS — N3281 Overactive bladder: Secondary | ICD-10-CM | POA: Diagnosis not present

## 2019-08-01 DIAGNOSIS — M810 Age-related osteoporosis without current pathological fracture: Secondary | ICD-10-CM | POA: Diagnosis not present

## 2019-08-01 DIAGNOSIS — R400 Somnolence: Secondary | ICD-10-CM | POA: Diagnosis not present

## 2019-08-01 DIAGNOSIS — I10 Essential (primary) hypertension: Secondary | ICD-10-CM | POA: Diagnosis not present

## 2019-08-01 DIAGNOSIS — E669 Obesity, unspecified: Secondary | ICD-10-CM | POA: Diagnosis not present

## 2019-08-01 DIAGNOSIS — R5383 Other fatigue: Secondary | ICD-10-CM | POA: Diagnosis not present

## 2019-08-01 DIAGNOSIS — E782 Mixed hyperlipidemia: Secondary | ICD-10-CM | POA: Diagnosis not present

## 2019-08-01 DIAGNOSIS — R7309 Other abnormal glucose: Secondary | ICD-10-CM | POA: Diagnosis not present

## 2019-08-07 ENCOUNTER — Ambulatory Visit: Payer: PPO | Attending: Internal Medicine

## 2019-08-07 DIAGNOSIS — Z23 Encounter for immunization: Secondary | ICD-10-CM | POA: Insufficient documentation

## 2019-08-07 NOTE — Progress Notes (Signed)
   Covid-19 Vaccination Clinic  Name:  Cynthia Tanner    MRN: [id number] DOB: [birth year]  08/07/2019  [name] was observed post Covid-19 immunization for 15 minutes without incidence. She was provided with Vaccine Information Sheet and instruction to access the V-Safe system.   [name] was instructed to call 911 with any severe reactions post vaccine: Marland Kitchen Difficulty breathing  . Swelling of your face and throat  . A fast heartbeat  . A bad rash all over your body  . Dizziness and weakness    Immunizations Administered    Name Date Dose VIS Date Route   Pfizer COVID-19 Vaccine 08/07/2019  2:20 PM 0.3 mL 05/27/2019 Intramuscular   Manufacturer: [place]   Lot: [id number]   [place]: [id number]

## 2019-08-30 ENCOUNTER — Ambulatory Visit: Payer: PPO | Attending: Internal Medicine

## 2019-08-30 DIAGNOSIS — Z23 Encounter for immunization: Secondary | ICD-10-CM

## 2019-08-30 NOTE — Progress Notes (Signed)
   Covid-19 Vaccination Clinic  Name:  Cynthia Tanner    MRN: [id number] DOB: [birth year]  08/30/2019  [name] was observed post Covid-19 immunization for 15 minutes without incident. She was provided with Vaccine Information Sheet and instruction to access the V-Safe system.   [name] was instructed to call 911 with any severe reactions post vaccine: Marland Kitchen Difficulty breathing  . Swelling of face and throat  . A fast heartbeat  . A bad rash all over body  . Dizziness and weakness   Immunizations Administered    Name Date Dose VIS Date Route   Pfizer COVID-19 Vaccine 08/30/2019 10:23 AM 0.3 mL 05/27/2019 Intramuscular   Manufacturer: [place]   Lot: [id number]   [place]: [id number]

## 2019-09-30 ENCOUNTER — Encounter: Payer: Self-pay | Admitting: Emergency Medicine

## 2019-09-30 ENCOUNTER — Emergency Department: Payer: PPO

## 2019-09-30 ENCOUNTER — Emergency Department
Admission: EM | Admit: 2019-09-30 | Discharge: 2019-10-01 | Disposition: A | Discharge status: Home / Self Care | Payer: PPO | Attending: Emergency Medicine | Admitting: Emergency Medicine

## 2019-09-30 ENCOUNTER — Other Ambulatory Visit: Payer: Self-pay

## 2019-09-30 DIAGNOSIS — S0003XA Contusion of scalp, initial encounter: Secondary | ICD-10-CM | POA: Insufficient documentation

## 2019-09-30 DIAGNOSIS — R0902 Hypoxemia: Secondary | ICD-10-CM | POA: Diagnosis not present

## 2019-09-30 DIAGNOSIS — W19XXXA Unspecified fall, initial encounter: Secondary | ICD-10-CM | POA: Insufficient documentation

## 2019-09-30 DIAGNOSIS — Y93G1 Activity, food preparation and clean up: Secondary | ICD-10-CM | POA: Diagnosis not present

## 2019-09-30 DIAGNOSIS — S199XXA Unspecified injury of neck, initial encounter: Secondary | ICD-10-CM | POA: Diagnosis not present

## 2019-09-30 DIAGNOSIS — S0990XA Unspecified injury of head, initial encounter: Secondary | ICD-10-CM

## 2019-09-30 DIAGNOSIS — Y999 Unspecified external cause status: Secondary | ICD-10-CM | POA: Diagnosis not present

## 2019-09-30 DIAGNOSIS — R52 Pain, unspecified: Secondary | ICD-10-CM | POA: Diagnosis not present

## 2019-09-30 DIAGNOSIS — Y92000 Kitchen of unspecified non-institutional (private) residence as  the place of occurrence of the external cause: Secondary | ICD-10-CM | POA: Insufficient documentation

## 2019-09-30 DIAGNOSIS — M542 Cervicalgia: Secondary | ICD-10-CM | POA: Diagnosis not present

## 2019-09-30 DIAGNOSIS — I10 Essential (primary) hypertension: Secondary | ICD-10-CM | POA: Diagnosis not present

## 2019-09-30 HISTORY — DX: Essential (primary) hypertension: I10

## 2019-09-30 NOTE — ED Triage Notes (Signed)
Pt presents to ED via [facility] with c/o mechanical fall. Pt states went to reach for the door knob and the door wasn't there. Pt reports falling and hitting the back of her head. Per EMS no LOC.   Pt states hx of HTN, states did take her BP meds today.

## 2019-09-30 NOTE — ED Notes (Signed)
Attempted to call daughter to be at bedside x 1. Phone went to VM.

## 2019-10-01 MED ORDER — ACETAMINOPHEN 325 MG PO TABS
650.0000 mg | ORAL_TABLET | Freq: Once | ORAL | Status: AC
  Administered 2019-10-01: 650 mg via ORAL
  Filled 2019-10-01: qty 2

## 2019-10-01 NOTE — ED Provider Notes (Signed)
[facility] Emergency Department Provider Note   ____________________________________________   First MD Initiated Contact with Patient 10/01/19 0002     (approximate)  I have reviewed the triage vital signs and the nursing notes.   HISTORY  Chief Complaint Fall    HPI Cynthia Tanner is a 85 y.o. female brought to the ED via EMS from home status post mechanical fall.  Patient was unloading dishes and went to reach for the doorknob which was not there, causing her to lose her balance and fall backwards, striking her head.  Denies LOC.  Fall occurred approximately 5 PM.  Denies vision changes, neck pain, chest pain, shortness of breath, abdominal pain, nausea, vomiting or dizziness.  Denies extremity weakness, numbness or tingling.  Denies anticoagulant use.       Past Medical History:  Diagnosis Date  . Hypertension     There are no problems to display for this patient.    Prior to Admission medications   Not on File    Allergies Patient has no known allergies.  No family history on file.  Social History Social History   Tobacco Use  . Smoking status: Not on file  Substance Use Topics  . Alcohol use: Not on file  . Drug use: Not on file  Non-smoker  Review of Systems  Constitutional: No fever/chills Eyes: No visual changes. ENT: No sore throat. Cardiovascular: Denies chest pain. Respiratory: Denies shortness of breath. Gastrointestinal: No abdominal pain.  No nausea, no vomiting.  No diarrhea.  No constipation. Genitourinary: Negative for dysuria. Musculoskeletal: Negative for back pain. Skin: Negative for rash. Neurological: Positive for head injury.  Negative for headaches, focal weakness or numbness.   ____________________________________________   PHYSICAL EXAM:  VITAL SIGNS: ED Triage Vitals  Enc Vitals Group     BP 09/30/19 1844 (!) 209/71     Pulse Rate 09/30/19 1844 70     Resp 09/30/19 1844 18     Temp  09/30/19 1844 98 F (36.7 C)     Temp Source 09/30/19 1844 Oral     SpO2 09/30/19 1844 94 %     Weight 09/30/19 1845 171 lb (77.6 kg)     Height 09/30/19 1845 4\' 11"  (1.499 m)     Head Circumference --      Peak Flow --      Pain Score 09/30/19 1845 5     Pain Loc --      Pain Edu? --      Excl. in [place]? --     Constitutional: Alert and oriented. Well appearing and in no acute distress. Eyes: Conjunctivae are normal. PERRL. EOMI. Head: Occipital scalp hematoma; no laceration. Nose: Atraumatic. Mouth/Throat: Mucous membranes are moist.  No dental malocclusion.  Neck: No stridor.  No cervical spine tenderness to palpation. Cardiovascular: Normal rate, regular rhythm. Grossly normal heart sounds.  Good peripheral circulation. Respiratory: Normal respiratory effort.  No retractions. Lungs CTAB. Gastrointestinal: Soft and nontender to light or deep palpation. No distention. No abdominal bruits. No CVA tenderness. Musculoskeletal: No lower extremity tenderness nor edema.  No joint effusions. Neurologic: Alert and oriented x3.  CN II-XII grossly intact.  Normal speech and language. No gross focal neurologic deficits are appreciated.  Skin:  Skin is warm, dry and intact. No rash noted. Psychiatric: Mood and affect are normal. Speech and behavior are normal.  ____________________________________________   LABS (all labs ordered are listed, but only abnormal results are displayed)  Labs Reviewed -  No data to display ____________________________________________  EKG  None ____________________________________________  RADIOLOGY  ED MD interpretation: No ICH, no cervical spine injury  Official radiology report(s): CT Head Wo Contrast  Result Date: 09/30/2019 CLINICAL DATA:  Fall. Trauma to back of head. Head and neck pain. EXAM: CT HEAD WITHOUT CONTRAST CT CERVICAL SPINE WITHOUT CONTRAST TECHNIQUE: Multidetector CT imaging of the head and cervical spine was performed following the  standard protocol without intravenous contrast. Multiplanar CT image reconstructions of the cervical spine were also generated. COMPARISON:  None. FINDINGS: CT HEAD FINDINGS Brain: Moderate periventricular white matter hypoattenuation is present bilaterally. Mild generalized atrophy is noted. The ventricles are proportionate to the degree of atrophy. No acute infarct, hemorrhage, or mass lesion is present. No significant extraaxial fluid collection is present. The brainstem and cerebellum are within normal limits. Vascular: Atherosclerotic calcifications are present within the cavernous internal carotid arteries bilaterally. No hyperdense vessel is present. Skull: Calvarium is intact. No focal lytic or blastic lesions are present. A hyperdense occipital scalp hematoma measures 3.5 x 1.2 x 3.0 cm. No underlying fracture is present. Sinuses/Orbits: The paranasal sinuses and mastoid air cells are clear. Bilateral lens replacements are noted. Globes and orbits are otherwise unremarkable. CT CERVICAL SPINE FINDINGS Alignment: Degenerative anterolisthesis at C3-4 measures 2.5 mm. Slight anterolisthesis is present at C7-T1. No other significant listhesis is present. Skull base and vertebrae: Craniocervical junction is normal. Vertebral body heights are maintained. No acute or healing fractures are present. Soft tissues and spinal canal: No prevertebral fluid or swelling. No visible canal hematoma. Disc levels: Facet hypertrophy is greatest at C3-4. Posterior elements are fused at C2-3. Endplate changes and uncovertebral spurring are noted at C4-5 and C5-6 greater than C6-7. Upper chest: The lung apices are clear. IMPRESSION: 1. 3.5 x 1.2 x 3.0 cm hyperdense occipital scalp hematoma without underlying fracture. 2. No acute intracranial abnormality. 3. Mild atrophy and white matter disease likely reflects the sequela of chronic microvascular ischemia. 4. Multilevel degenerative changes of the cervical spine without acute  fracture or traumatic subluxation. Electronically Signed   By: San Morelle M.D.   On: 09/30/2019 19:30   CT Cervical Spine Wo Contrast  Result Date: 09/30/2019 CLINICAL DATA:  Fall. Trauma to back of head. Head and neck pain. EXAM: CT HEAD WITHOUT CONTRAST CT CERVICAL SPINE WITHOUT CONTRAST TECHNIQUE: Multidetector CT imaging of the head and cervical spine was performed following the standard protocol without intravenous contrast. Multiplanar CT image reconstructions of the cervical spine were also generated. COMPARISON:  None. FINDINGS: CT HEAD FINDINGS Brain: Moderate periventricular white matter hypoattenuation is present bilaterally. Mild generalized atrophy is noted. The ventricles are proportionate to the degree of atrophy. No acute infarct, hemorrhage, or mass lesion is present. No significant extraaxial fluid collection is present. The brainstem and cerebellum are within normal limits. Vascular: Atherosclerotic calcifications are present within the cavernous internal carotid arteries bilaterally. No hyperdense vessel is present. Skull: Calvarium is intact. No focal lytic or blastic lesions are present. A hyperdense occipital scalp hematoma measures 3.5 x 1.2 x 3.0 cm. No underlying fracture is present. Sinuses/Orbits: The paranasal sinuses and mastoid air cells are clear. Bilateral lens replacements are noted. Globes and orbits are otherwise unremarkable. CT CERVICAL SPINE FINDINGS Alignment: Degenerative anterolisthesis at C3-4 measures 2.5 mm. Slight anterolisthesis is present at C7-T1. No other significant listhesis is present. Skull base and vertebrae: Craniocervical junction is normal. Vertebral body heights are maintained. No acute or healing fractures are present. Soft tissues and spinal canal:  No prevertebral fluid or swelling. No visible canal hematoma. Disc levels: Facet hypertrophy is greatest at C3-4. Posterior elements are fused at C2-3. Endplate changes and uncovertebral spurring  are noted at C4-5 and C5-6 greater than C6-7. Upper chest: The lung apices are clear. IMPRESSION: 1. 3.5 x 1.2 x 3.0 cm hyperdense occipital scalp hematoma without underlying fracture. 2. No acute intracranial abnormality. 3. Mild atrophy and white matter disease likely reflects the sequela of chronic microvascular ischemia. 4. Multilevel degenerative changes of the cervical spine without acute fracture or traumatic subluxation. Electronically Signed   By: San Morelle M.D.   On: 09/30/2019 19:30    ____________________________________________   PROCEDURES  Procedure(s) performed (including Critical Care):  Procedures   ____________________________________________   INITIAL IMPRESSION / ASSESSMENT AND PLAN / ED COURSE  As part of my medical decision making, I reviewed the following data within the [place] notes reviewed and incorporated, Radiograph reviewed and Notes from prior ED visits     Niani H Carrithers was evaluated in Emergency Department on 10/01/2019 for the symptoms described in the history of present illness. She was evaluated in the context of the global COVID-19 pandemic, which necessitated consideration that the patient might be at risk for infection with the SARS-CoV-2 virus that causes COVID-19. Institutional protocols and algorithms that pertain to the evaluation of patients at risk for COVID-19 are in a state of rapid change based on information released by regulatory bodies including the CDC and federal and state organizations. These policies and algorithms were followed during the patient's care in the ED.    85 year old female who presents status post mechanical fall with occipital hematoma.  Differential diagnosis includes but is not limited to minor head injury, scalp contusion, ICH, cervical spine injury, etc.  CT negative for ICH or cervical spine injury.  Administer Tylenol, encourage ice packs to reduce swelling, and follow-up  with patient's PCP closely.  Strict return precautions given.  Patient verbalizes understanding and agrees with plan of care.      ____________________________________________   FINAL CLINICAL IMPRESSION(S) / ED DIAGNOSES  Final diagnoses:  Fall, initial encounter  Injury of head, initial encounter  Hematoma of scalp, initial encounter     ED Discharge Orders    None       Note:  This document was prepared using Dragon voice recognition software and may include unintentional dictation errors.   Paulette Blanch, MD 10/01/19 [phone]

## 2019-10-01 NOTE — Discharge Instructions (Addendum)
1.  You may take Tylenol as needed for pain. 2.  Apply ice to affected area several times daily to reduce swelling. 3.  Return to the ER for worsening symptoms, persistent vomiting, lethargy or other concerns.

## 2019-10-07 DIAGNOSIS — S060X9A Concussion with loss of consciousness of unspecified duration, initial encounter: Secondary | ICD-10-CM | POA: Diagnosis not present

## 2019-10-07 DIAGNOSIS — W19XXXA Unspecified fall, initial encounter: Secondary | ICD-10-CM | POA: Diagnosis not present

## 2019-10-07 DIAGNOSIS — S0003XA Contusion of scalp, initial encounter: Secondary | ICD-10-CM | POA: Diagnosis not present

## 2019-10-07 DIAGNOSIS — S134XXA Sprain of ligaments of cervical spine, initial encounter: Secondary | ICD-10-CM | POA: Diagnosis not present

## 2020-01-09 DIAGNOSIS — H02834 Dermatochalasis of left upper eyelid: Secondary | ICD-10-CM | POA: Diagnosis not present

## 2020-01-09 DIAGNOSIS — H02831 Dermatochalasis of right upper eyelid: Secondary | ICD-10-CM | POA: Diagnosis not present

## 2020-01-09 DIAGNOSIS — Z961 Presence of intraocular lens: Secondary | ICD-10-CM | POA: Diagnosis not present

## 2020-01-09 DIAGNOSIS — H43813 Vitreous degeneration, bilateral: Secondary | ICD-10-CM | POA: Diagnosis not present

## 2020-01-09 DIAGNOSIS — H04123 Dry eye syndrome of bilateral lacrimal glands: Secondary | ICD-10-CM | POA: Diagnosis not present

## 2020-01-10 DIAGNOSIS — Z1231 Encounter for screening mammogram for malignant neoplasm of breast: Secondary | ICD-10-CM | POA: Diagnosis not present

## 2020-02-27 DIAGNOSIS — F411 Generalized anxiety disorder: Secondary | ICD-10-CM | POA: Diagnosis not present

## 2020-02-27 DIAGNOSIS — M1990 Unspecified osteoarthritis, unspecified site: Secondary | ICD-10-CM | POA: Diagnosis not present

## 2020-02-27 DIAGNOSIS — L309 Dermatitis, unspecified: Secondary | ICD-10-CM | POA: Diagnosis not present

## 2020-02-27 DIAGNOSIS — M810 Age-related osteoporosis without current pathological fracture: Secondary | ICD-10-CM | POA: Diagnosis not present

## 2020-02-27 DIAGNOSIS — Z0000 Encounter for general adult medical examination without abnormal findings: Secondary | ICD-10-CM | POA: Diagnosis not present

## 2020-02-27 DIAGNOSIS — R7309 Other abnormal glucose: Secondary | ICD-10-CM | POA: Diagnosis not present

## 2020-02-27 DIAGNOSIS — K219 Gastro-esophageal reflux disease without esophagitis: Secondary | ICD-10-CM | POA: Diagnosis not present

## 2020-02-27 DIAGNOSIS — E782 Mixed hyperlipidemia: Secondary | ICD-10-CM | POA: Diagnosis not present

## 2020-02-27 DIAGNOSIS — J301 Allergic rhinitis due to pollen: Secondary | ICD-10-CM | POA: Diagnosis not present

## 2020-02-27 DIAGNOSIS — I10 Essential (primary) hypertension: Secondary | ICD-10-CM | POA: Diagnosis not present

## 2020-02-27 DIAGNOSIS — E669 Obesity, unspecified: Secondary | ICD-10-CM | POA: Diagnosis not present

## 2020-03-28 DIAGNOSIS — Z20822 Contact with and (suspected) exposure to covid-19: Secondary | ICD-10-CM | POA: Diagnosis not present

## 2020-04-21 DIAGNOSIS — Z23 Encounter for immunization: Secondary | ICD-10-CM | POA: Diagnosis not present

## 2020-08-29 DIAGNOSIS — M810 Age-related osteoporosis without current pathological fracture: Secondary | ICD-10-CM | POA: Diagnosis not present

## 2020-08-29 DIAGNOSIS — R7309 Other abnormal glucose: Secondary | ICD-10-CM | POA: Diagnosis not present

## 2020-08-29 DIAGNOSIS — N760 Acute vaginitis: Secondary | ICD-10-CM | POA: Diagnosis not present

## 2020-08-29 DIAGNOSIS — N3281 Overactive bladder: Secondary | ICD-10-CM | POA: Diagnosis not present

## 2020-08-29 DIAGNOSIS — I10 Essential (primary) hypertension: Secondary | ICD-10-CM | POA: Diagnosis not present

## 2020-08-29 DIAGNOSIS — E669 Obesity, unspecified: Secondary | ICD-10-CM | POA: Diagnosis not present

## 2020-08-29 DIAGNOSIS — E782 Mixed hyperlipidemia: Secondary | ICD-10-CM | POA: Diagnosis not present

## 2020-09-05 DIAGNOSIS — N760 Acute vaginitis: Secondary | ICD-10-CM | POA: Diagnosis not present

## 2020-09-05 DIAGNOSIS — N898 Other specified noninflammatory disorders of vagina: Secondary | ICD-10-CM | POA: Diagnosis not present

## 2020-09-07 IMAGING — CT CT HEAD W/O CM
3 series · 14 of 45 positions shown, 16 images · non-contrast
Comparison: None.

CLINICAL DATA: Fall. Trauma to back of head. Head and neck pain.

EXAM:
CT HEAD WITHOUT CONTRAST
CT CERVICAL SPINE WITHOUT CONTRAST
TECHNIQUE: Multidetector CT imaging of the head and cervical spine was
performed following the standard protocol without intravenous
contrast. Multiplanar CT image reconstructions of the cervical spine
were also generated.

[Series 2: head wo · axial · 0.41mm/px · z∈[+457,+572]mm · 8 of 28 slices shown, 10 images]
[im 3/28  brain]
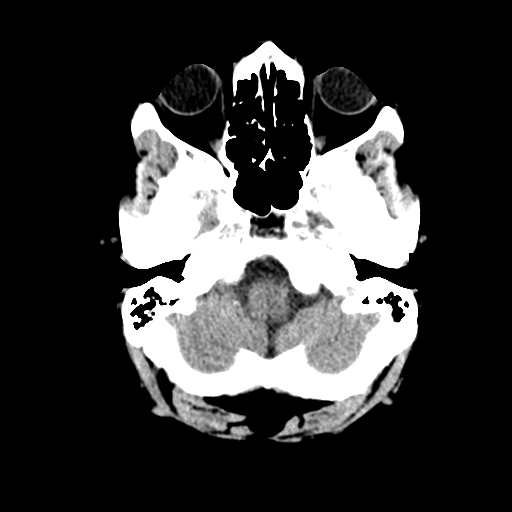
[im 3/28  bone]
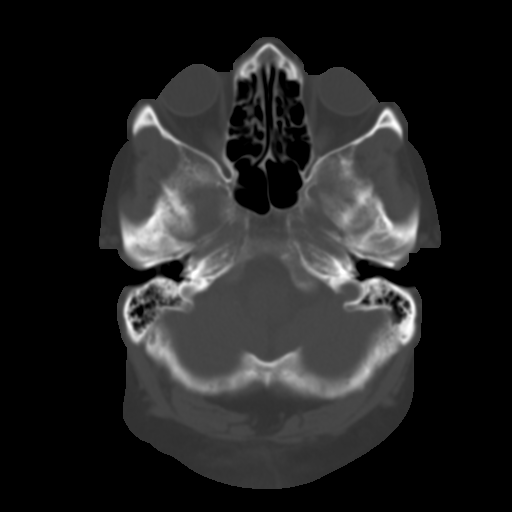
[im 6/28  brain]
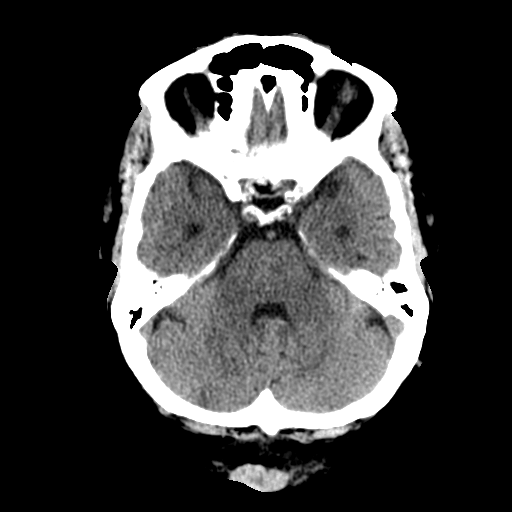
[im 10/28  brain]
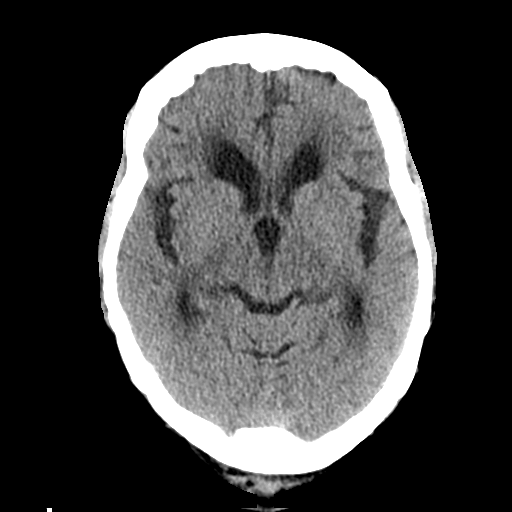
[im 13/28  brain]
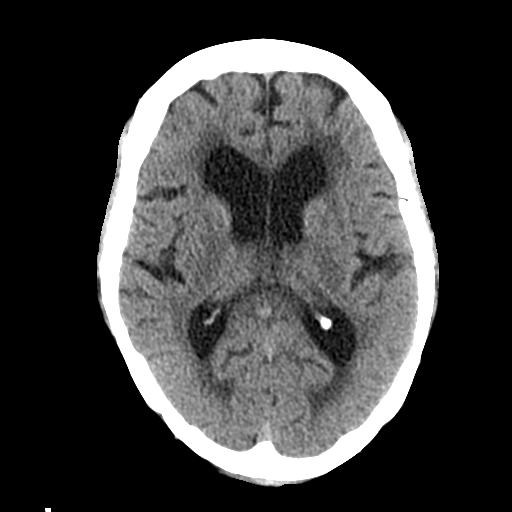
[im 16/28  brain]
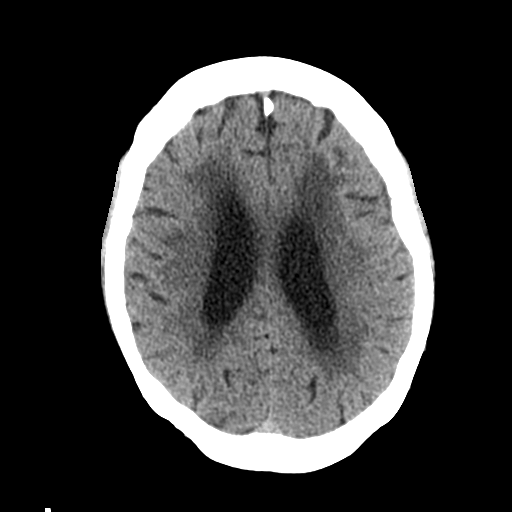
[im 16/28  bone]
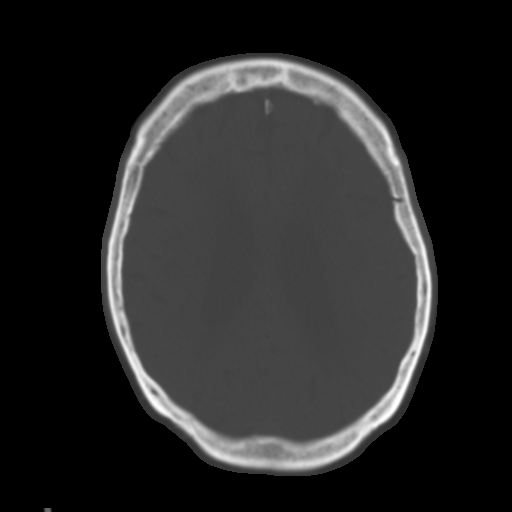
[im 19/28  brain]
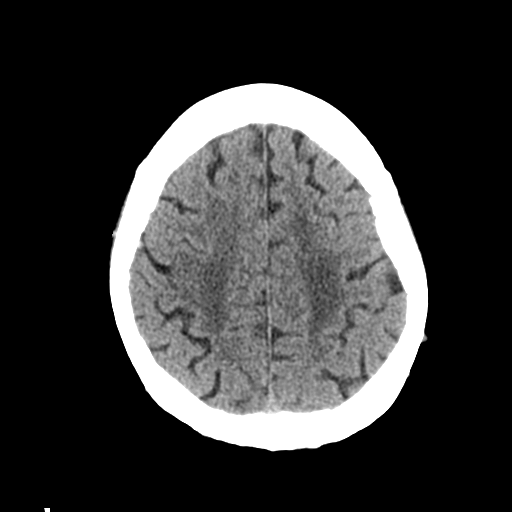
[im 23/28  brain]
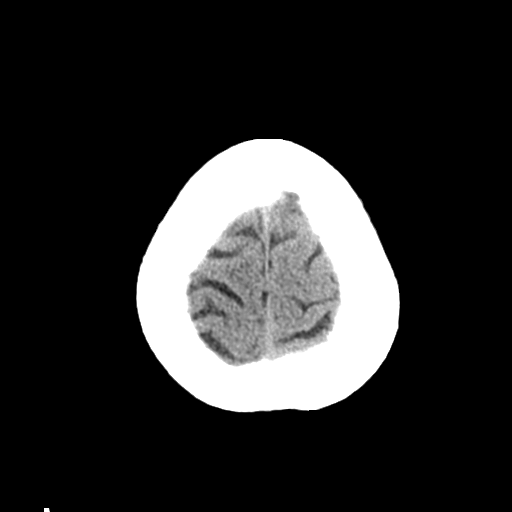
[im 26/28  brain]
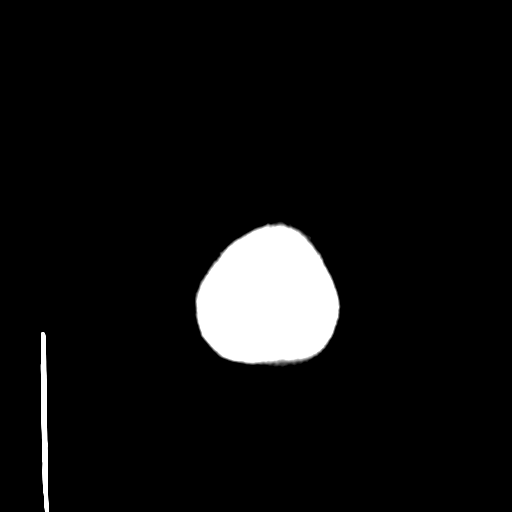

[Series 4: coronal soft tissue · coronal · 0.27mm/px · 3 of 61 slices shown]
[im 21/61  brain]
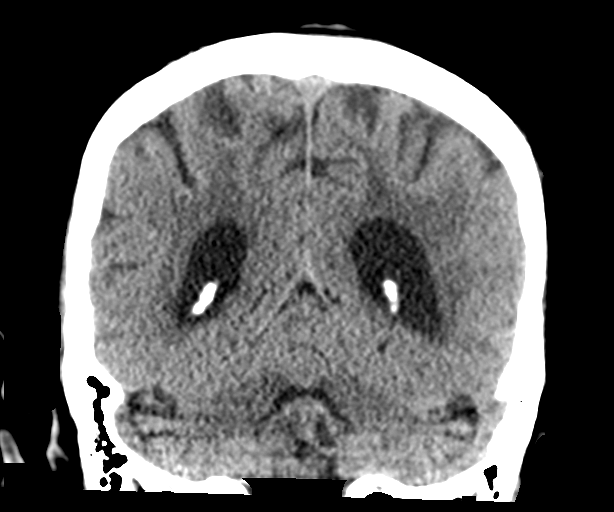
[im 27/61  brain]
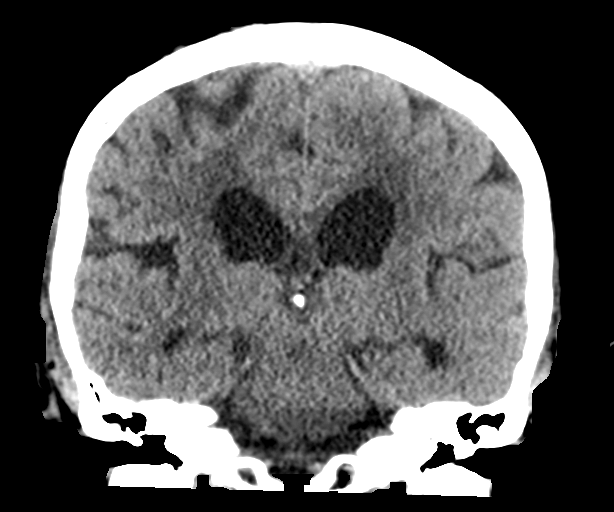
[im 34/61  brain]
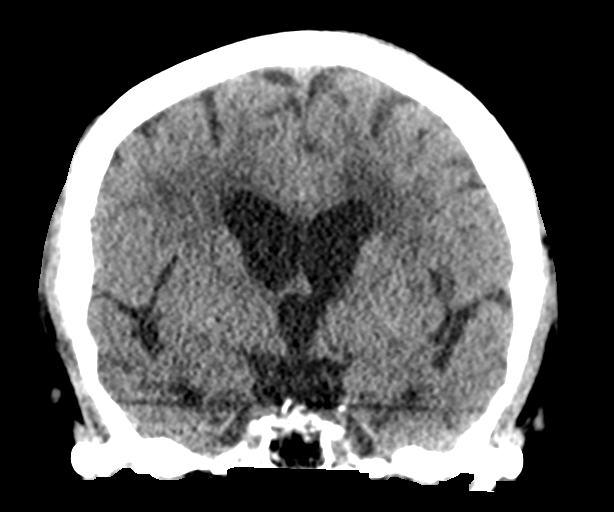

[Series 5: sagittal soft tissue · sagittal · 0.29mm/px · 3 of 49 slices shown]
[im 17/49  brain]
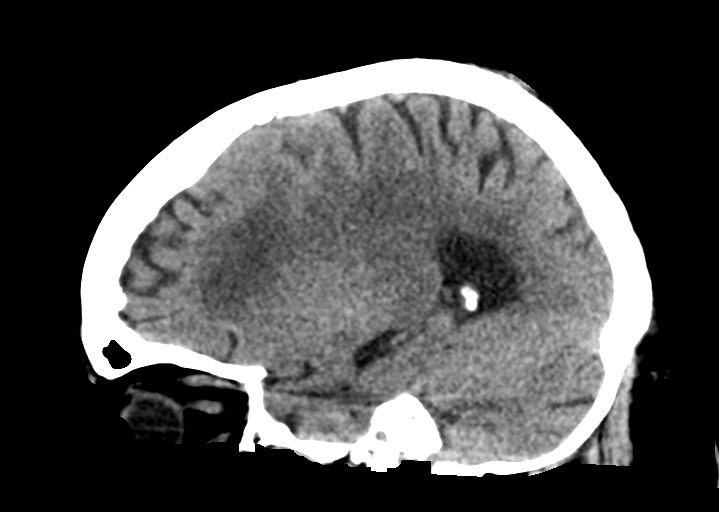
[im 25/49  brain]
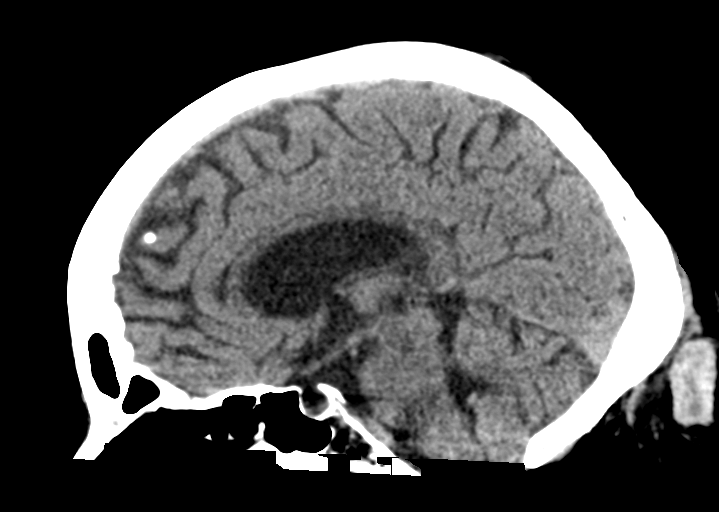
[im 33/49  brain]
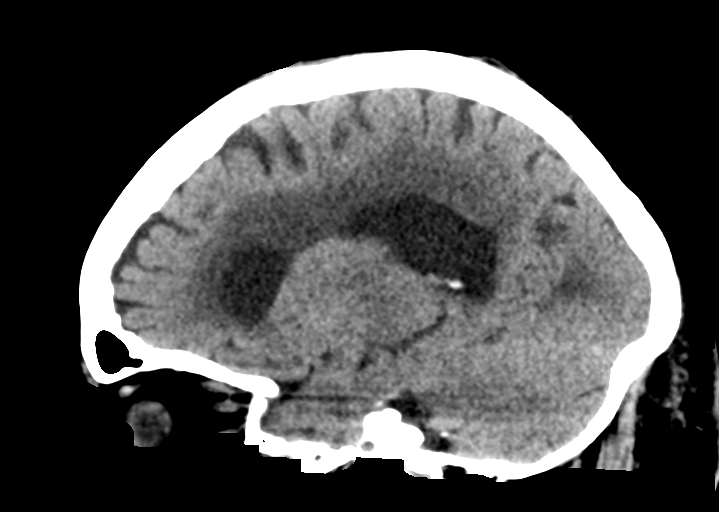

[14 of 45 positions shown; findings below may reference images not displayed]

FINDINGS: CT HEAD FINDINGS

Brain: Moderate periventricular white matter hypoattenuation is
present bilaterally. Mild generalized atrophy is noted. The
ventricles are proportionate to the degree of atrophy.

No acute infarct, hemorrhage, or mass lesion is present. No
significant extraaxial fluid collection is present. The brainstem
and cerebellum are within normal limits.

Vascular: Atherosclerotic calcifications are present within the
cavernous internal carotid arteries bilaterally. No hyperdense
vessel is present.

Skull: Calvarium is intact. No focal lytic or blastic lesions are
present. A hyperdense occipital scalp hematoma measures 3.5 x 1.2 x
3.0 cm. No underlying fracture is present.

Sinuses/Orbits: The paranasal sinuses and mastoid air cells are
clear. Bilateral lens replacements are noted. Globes and orbits are
otherwise unremarkable.

CT CERVICAL SPINE FINDINGS

Alignment: Degenerative anterolisthesis at C3-4 measures 2.5 mm.
Slight anterolisthesis is present at C7-T1. No other significant
listhesis is present.

Skull base and vertebrae: Craniocervical junction is normal.
Vertebral body heights are maintained. No acute or healing fractures
are present.

Soft tissues and spinal canal: No prevertebral fluid or swelling. No
visible canal hematoma.

Disc levels: Facet hypertrophy is greatest at C3-4. Posterior
elements are fused at C2-3. Endplate changes and uncovertebral
spurring are noted at C4-5 and C5-6 greater than C6-7.

Upper chest: The lung apices are clear.
IMPRESSION: 1. 3.5 x 1.2 x 3.0 cm hyperdense occipital scalp hematoma without
underlying fracture.
2. No acute intracranial abnormality.
3. Mild atrophy and white matter disease likely reflects the sequela
of chronic microvascular ischemia.
4. Multilevel degenerative changes of the cervical spine without
acute fracture or traumatic subluxation.

## 2021-01-14 DIAGNOSIS — I10 Essential (primary) hypertension: Secondary | ICD-10-CM | POA: Diagnosis not present

## 2021-01-14 DIAGNOSIS — B354 Tinea corporis: Secondary | ICD-10-CM | POA: Diagnosis not present

## 2021-01-15 DIAGNOSIS — Z1231 Encounter for screening mammogram for malignant neoplasm of breast: Secondary | ICD-10-CM | POA: Diagnosis not present

## 2021-01-17 DIAGNOSIS — B354 Tinea corporis: Secondary | ICD-10-CM | POA: Diagnosis not present

## 2021-01-31 DIAGNOSIS — L570 Actinic keratosis: Secondary | ICD-10-CM | POA: Diagnosis not present

## 2021-01-31 DIAGNOSIS — L820 Inflamed seborrheic keratosis: Secondary | ICD-10-CM | POA: Diagnosis not present

## 2021-02-04 DIAGNOSIS — M9902 Segmental and somatic dysfunction of thoracic region: Secondary | ICD-10-CM | POA: Diagnosis not present

## 2021-02-04 DIAGNOSIS — M5383 Other specified dorsopathies, cervicothoracic region: Secondary | ICD-10-CM | POA: Diagnosis not present

## 2021-02-04 DIAGNOSIS — M7522 Bicipital tendinitis, left shoulder: Secondary | ICD-10-CM | POA: Diagnosis not present

## 2021-02-04 DIAGNOSIS — M9907 Segmental and somatic dysfunction of upper extremity: Secondary | ICD-10-CM | POA: Diagnosis not present

## 2021-02-04 DIAGNOSIS — M9901 Segmental and somatic dysfunction of cervical region: Secondary | ICD-10-CM | POA: Diagnosis not present

## 2021-02-06 DIAGNOSIS — M9901 Segmental and somatic dysfunction of cervical region: Secondary | ICD-10-CM | POA: Diagnosis not present

## 2021-02-06 DIAGNOSIS — M9907 Segmental and somatic dysfunction of upper extremity: Secondary | ICD-10-CM | POA: Diagnosis not present

## 2021-02-06 DIAGNOSIS — M5383 Other specified dorsopathies, cervicothoracic region: Secondary | ICD-10-CM | POA: Diagnosis not present

## 2021-02-06 DIAGNOSIS — M7522 Bicipital tendinitis, left shoulder: Secondary | ICD-10-CM | POA: Diagnosis not present

## 2021-02-06 DIAGNOSIS — M9902 Segmental and somatic dysfunction of thoracic region: Secondary | ICD-10-CM | POA: Diagnosis not present

## 2021-02-11 DIAGNOSIS — M7522 Bicipital tendinitis, left shoulder: Secondary | ICD-10-CM | POA: Diagnosis not present

## 2021-02-11 DIAGNOSIS — M9902 Segmental and somatic dysfunction of thoracic region: Secondary | ICD-10-CM | POA: Diagnosis not present

## 2021-02-11 DIAGNOSIS — M9901 Segmental and somatic dysfunction of cervical region: Secondary | ICD-10-CM | POA: Diagnosis not present

## 2021-02-11 DIAGNOSIS — M9907 Segmental and somatic dysfunction of upper extremity: Secondary | ICD-10-CM | POA: Diagnosis not present

## 2021-02-11 DIAGNOSIS — M5383 Other specified dorsopathies, cervicothoracic region: Secondary | ICD-10-CM | POA: Diagnosis not present

## 2021-02-13 DIAGNOSIS — M7522 Bicipital tendinitis, left shoulder: Secondary | ICD-10-CM | POA: Diagnosis not present

## 2021-02-13 DIAGNOSIS — M9902 Segmental and somatic dysfunction of thoracic region: Secondary | ICD-10-CM | POA: Diagnosis not present

## 2021-02-13 DIAGNOSIS — M9901 Segmental and somatic dysfunction of cervical region: Secondary | ICD-10-CM | POA: Diagnosis not present

## 2021-02-13 DIAGNOSIS — M9907 Segmental and somatic dysfunction of upper extremity: Secondary | ICD-10-CM | POA: Diagnosis not present

## 2021-02-13 DIAGNOSIS — M5383 Other specified dorsopathies, cervicothoracic region: Secondary | ICD-10-CM | POA: Diagnosis not present

## 2021-02-20 DIAGNOSIS — M9902 Segmental and somatic dysfunction of thoracic region: Secondary | ICD-10-CM | POA: Diagnosis not present

## 2021-02-20 DIAGNOSIS — M9907 Segmental and somatic dysfunction of upper extremity: Secondary | ICD-10-CM | POA: Diagnosis not present

## 2021-02-20 DIAGNOSIS — M9901 Segmental and somatic dysfunction of cervical region: Secondary | ICD-10-CM | POA: Diagnosis not present

## 2021-02-20 DIAGNOSIS — M7522 Bicipital tendinitis, left shoulder: Secondary | ICD-10-CM | POA: Diagnosis not present

## 2021-02-20 DIAGNOSIS — M5383 Other specified dorsopathies, cervicothoracic region: Secondary | ICD-10-CM | POA: Diagnosis not present

## 2021-02-28 DIAGNOSIS — M7522 Bicipital tendinitis, left shoulder: Secondary | ICD-10-CM | POA: Diagnosis not present

## 2021-02-28 DIAGNOSIS — M5383 Other specified dorsopathies, cervicothoracic region: Secondary | ICD-10-CM | POA: Diagnosis not present

## 2021-02-28 DIAGNOSIS — M9901 Segmental and somatic dysfunction of cervical region: Secondary | ICD-10-CM | POA: Diagnosis not present

## 2021-02-28 DIAGNOSIS — M9902 Segmental and somatic dysfunction of thoracic region: Secondary | ICD-10-CM | POA: Diagnosis not present

## 2021-02-28 DIAGNOSIS — M9907 Segmental and somatic dysfunction of upper extremity: Secondary | ICD-10-CM | POA: Diagnosis not present

## 2021-03-29 DIAGNOSIS — Z23 Encounter for immunization: Secondary | ICD-10-CM | POA: Diagnosis not present

## 2021-04-04 DIAGNOSIS — Z23 Encounter for immunization: Secondary | ICD-10-CM | POA: Diagnosis not present

## 2021-04-04 DIAGNOSIS — Z872 Personal history of diseases of the skin and subcutaneous tissue: Secondary | ICD-10-CM | POA: Diagnosis not present

## 2021-04-04 DIAGNOSIS — D485 Neoplasm of uncertain behavior of skin: Secondary | ICD-10-CM | POA: Diagnosis not present

## 2021-04-04 DIAGNOSIS — L570 Actinic keratosis: Secondary | ICD-10-CM | POA: Diagnosis not present

## 2021-04-18 DIAGNOSIS — E669 Obesity, unspecified: Secondary | ICD-10-CM | POA: Diagnosis not present

## 2021-04-18 DIAGNOSIS — Z0000 Encounter for general adult medical examination without abnormal findings: Secondary | ICD-10-CM | POA: Diagnosis not present

## 2021-04-18 DIAGNOSIS — I10 Essential (primary) hypertension: Secondary | ICD-10-CM | POA: Diagnosis not present

## 2021-04-18 DIAGNOSIS — K219 Gastro-esophageal reflux disease without esophagitis: Secondary | ICD-10-CM | POA: Diagnosis not present

## 2021-04-18 DIAGNOSIS — J301 Allergic rhinitis due to pollen: Secondary | ICD-10-CM | POA: Diagnosis not present

## 2021-04-18 DIAGNOSIS — M1990 Unspecified osteoarthritis, unspecified site: Secondary | ICD-10-CM | POA: Diagnosis not present

## 2021-04-18 DIAGNOSIS — F411 Generalized anxiety disorder: Secondary | ICD-10-CM | POA: Diagnosis not present

## 2021-04-18 DIAGNOSIS — E782 Mixed hyperlipidemia: Secondary | ICD-10-CM | POA: Diagnosis not present

## 2021-04-18 DIAGNOSIS — R7303 Prediabetes: Secondary | ICD-10-CM | POA: Diagnosis not present

## 2021-04-18 DIAGNOSIS — M810 Age-related osteoporosis without current pathological fracture: Secondary | ICD-10-CM | POA: Diagnosis not present

## 2021-04-18 DIAGNOSIS — N3281 Overactive bladder: Secondary | ICD-10-CM | POA: Diagnosis not present

## 2021-04-24 DIAGNOSIS — M85851 Other specified disorders of bone density and structure, right thigh: Secondary | ICD-10-CM | POA: Diagnosis not present

## 2021-04-24 DIAGNOSIS — Z780 Asymptomatic menopausal state: Secondary | ICD-10-CM | POA: Diagnosis not present

## 2021-04-24 DIAGNOSIS — M85852 Other specified disorders of bone density and structure, left thigh: Secondary | ICD-10-CM | POA: Diagnosis not present

## 2021-04-26 DIAGNOSIS — Z23 Encounter for immunization: Secondary | ICD-10-CM | POA: Diagnosis not present

## 2021-04-26 DIAGNOSIS — L570 Actinic keratosis: Secondary | ICD-10-CM | POA: Diagnosis not present

## 2021-06-14 DIAGNOSIS — J069 Acute upper respiratory infection, unspecified: Secondary | ICD-10-CM | POA: Diagnosis not present

## 2021-06-14 DIAGNOSIS — R519 Headache, unspecified: Secondary | ICD-10-CM | POA: Diagnosis not present

## 2021-10-16 DIAGNOSIS — E669 Obesity, unspecified: Secondary | ICD-10-CM | POA: Diagnosis not present

## 2021-10-16 DIAGNOSIS — E782 Mixed hyperlipidemia: Secondary | ICD-10-CM | POA: Diagnosis not present

## 2021-10-16 DIAGNOSIS — N3281 Overactive bladder: Secondary | ICD-10-CM | POA: Diagnosis not present

## 2021-10-16 DIAGNOSIS — I10 Essential (primary) hypertension: Secondary | ICD-10-CM | POA: Diagnosis not present

## 2021-10-16 DIAGNOSIS — R7309 Other abnormal glucose: Secondary | ICD-10-CM | POA: Diagnosis not present

## 2021-10-16 DIAGNOSIS — K625 Hemorrhage of anus and rectum: Secondary | ICD-10-CM | POA: Diagnosis not present

## 2021-10-16 DIAGNOSIS — F411 Generalized anxiety disorder: Secondary | ICD-10-CM | POA: Diagnosis not present

## 2021-11-19 DIAGNOSIS — K648 Other hemorrhoids: Secondary | ICD-10-CM | POA: Diagnosis not present

## 2021-11-19 DIAGNOSIS — R030 Elevated blood-pressure reading, without diagnosis of hypertension: Secondary | ICD-10-CM | POA: Diagnosis not present

## 2021-11-19 DIAGNOSIS — Z86010 Personal history of colonic polyps: Secondary | ICD-10-CM | POA: Diagnosis not present

## 2021-11-19 DIAGNOSIS — K5909 Other constipation: Secondary | ICD-10-CM | POA: Diagnosis not present

## 2021-12-23 DIAGNOSIS — Z01419 Encounter for gynecological examination (general) (routine) without abnormal findings: Secondary | ICD-10-CM | POA: Diagnosis not present

## 2022-01-21 DIAGNOSIS — Z1231 Encounter for screening mammogram for malignant neoplasm of breast: Secondary | ICD-10-CM | POA: Diagnosis not present

## 2022-01-24 DIAGNOSIS — N6001 Solitary cyst of right breast: Secondary | ICD-10-CM | POA: Diagnosis not present

## 2022-01-24 DIAGNOSIS — R928 Other abnormal and inconclusive findings on diagnostic imaging of breast: Secondary | ICD-10-CM | POA: Diagnosis not present

## 2022-01-28 DIAGNOSIS — H02831 Dermatochalasis of right upper eyelid: Secondary | ICD-10-CM | POA: Diagnosis not present

## 2022-01-28 DIAGNOSIS — H04123 Dry eye syndrome of bilateral lacrimal glands: Secondary | ICD-10-CM | POA: Diagnosis not present

## 2022-01-28 DIAGNOSIS — Z961 Presence of intraocular lens: Secondary | ICD-10-CM | POA: Diagnosis not present

## 2022-01-28 DIAGNOSIS — H43813 Vitreous degeneration, bilateral: Secondary | ICD-10-CM | POA: Diagnosis not present

## 2022-01-28 DIAGNOSIS — H02834 Dermatochalasis of left upper eyelid: Secondary | ICD-10-CM | POA: Diagnosis not present

## 2022-03-15 DIAGNOSIS — Z23 Encounter for immunization: Secondary | ICD-10-CM | POA: Diagnosis not present

## 2022-05-01 DIAGNOSIS — N3281 Overactive bladder: Secondary | ICD-10-CM | POA: Diagnosis not present

## 2022-05-01 DIAGNOSIS — M810 Age-related osteoporosis without current pathological fracture: Secondary | ICD-10-CM | POA: Diagnosis not present

## 2022-05-01 DIAGNOSIS — Z0000 Encounter for general adult medical examination without abnormal findings: Secondary | ICD-10-CM | POA: Diagnosis not present

## 2022-05-01 DIAGNOSIS — Z23 Encounter for immunization: Secondary | ICD-10-CM | POA: Diagnosis not present

## 2022-05-01 DIAGNOSIS — E782 Mixed hyperlipidemia: Secondary | ICD-10-CM | POA: Diagnosis not present

## 2022-05-01 DIAGNOSIS — K219 Gastro-esophageal reflux disease without esophagitis: Secondary | ICD-10-CM | POA: Diagnosis not present

## 2022-05-01 DIAGNOSIS — R7303 Prediabetes: Secondary | ICD-10-CM | POA: Diagnosis not present

## 2022-05-01 DIAGNOSIS — J301 Allergic rhinitis due to pollen: Secondary | ICD-10-CM | POA: Diagnosis not present

## 2022-05-01 DIAGNOSIS — M1990 Unspecified osteoarthritis, unspecified site: Secondary | ICD-10-CM | POA: Diagnosis not present

## 2022-05-01 DIAGNOSIS — E669 Obesity, unspecified: Secondary | ICD-10-CM | POA: Diagnosis not present

## 2022-05-01 DIAGNOSIS — F411 Generalized anxiety disorder: Secondary | ICD-10-CM | POA: Diagnosis not present

## 2022-05-01 DIAGNOSIS — I10 Essential (primary) hypertension: Secondary | ICD-10-CM | POA: Diagnosis not present

## 2022-11-05 DIAGNOSIS — E782 Mixed hyperlipidemia: Secondary | ICD-10-CM | POA: Diagnosis not present

## 2022-11-05 DIAGNOSIS — E669 Obesity, unspecified: Secondary | ICD-10-CM | POA: Diagnosis not present

## 2022-11-05 DIAGNOSIS — R7303 Prediabetes: Secondary | ICD-10-CM | POA: Diagnosis not present

## 2022-11-05 DIAGNOSIS — N183 Chronic kidney disease, stage 3 unspecified: Secondary | ICD-10-CM | POA: Diagnosis not present

## 2022-11-05 DIAGNOSIS — I10 Essential (primary) hypertension: Secondary | ICD-10-CM | POA: Diagnosis not present

## 2023-01-27 DIAGNOSIS — Z1231 Encounter for screening mammogram for malignant neoplasm of breast: Secondary | ICD-10-CM | POA: Diagnosis not present

## 2023-03-28 DIAGNOSIS — Z23 Encounter for immunization: Secondary | ICD-10-CM | POA: Diagnosis not present

## 2023-05-05 DIAGNOSIS — J301 Allergic rhinitis due to pollen: Secondary | ICD-10-CM | POA: Diagnosis not present

## 2023-05-05 DIAGNOSIS — E669 Obesity, unspecified: Secondary | ICD-10-CM | POA: Diagnosis not present

## 2023-05-05 DIAGNOSIS — M25512 Pain in left shoulder: Secondary | ICD-10-CM | POA: Diagnosis not present

## 2023-05-05 DIAGNOSIS — F411 Generalized anxiety disorder: Secondary | ICD-10-CM | POA: Diagnosis not present

## 2023-05-05 DIAGNOSIS — M1990 Unspecified osteoarthritis, unspecified site: Secondary | ICD-10-CM | POA: Diagnosis not present

## 2023-05-05 DIAGNOSIS — M810 Age-related osteoporosis without current pathological fracture: Secondary | ICD-10-CM | POA: Diagnosis not present

## 2023-05-05 DIAGNOSIS — R7303 Prediabetes: Secondary | ICD-10-CM | POA: Diagnosis not present

## 2023-05-05 DIAGNOSIS — I10 Essential (primary) hypertension: Secondary | ICD-10-CM | POA: Diagnosis not present

## 2023-05-05 DIAGNOSIS — K219 Gastro-esophageal reflux disease without esophagitis: Secondary | ICD-10-CM | POA: Diagnosis not present

## 2023-05-05 DIAGNOSIS — N3281 Overactive bladder: Secondary | ICD-10-CM | POA: Diagnosis not present

## 2023-05-05 DIAGNOSIS — E782 Mixed hyperlipidemia: Secondary | ICD-10-CM | POA: Diagnosis not present

## 2023-05-05 DIAGNOSIS — Z0000 Encounter for general adult medical examination without abnormal findings: Secondary | ICD-10-CM | POA: Diagnosis not present

## 2023-05-06 DIAGNOSIS — I10 Essential (primary) hypertension: Secondary | ICD-10-CM | POA: Diagnosis not present

## 2023-06-05 DIAGNOSIS — M8588 Other specified disorders of bone density and structure, other site: Secondary | ICD-10-CM | POA: Diagnosis not present

## 2023-06-05 DIAGNOSIS — M810 Age-related osteoporosis without current pathological fracture: Secondary | ICD-10-CM | POA: Diagnosis not present

## 2023-11-02 DIAGNOSIS — R7303 Prediabetes: Secondary | ICD-10-CM | POA: Diagnosis not present

## 2023-11-02 DIAGNOSIS — N183 Chronic kidney disease, stage 3 unspecified: Secondary | ICD-10-CM | POA: Diagnosis not present

## 2023-11-02 DIAGNOSIS — I10 Essential (primary) hypertension: Secondary | ICD-10-CM | POA: Diagnosis not present

## 2023-11-02 DIAGNOSIS — E782 Mixed hyperlipidemia: Secondary | ICD-10-CM | POA: Diagnosis not present

## 2024-02-02 DIAGNOSIS — Z1231 Encounter for screening mammogram for malignant neoplasm of breast: Secondary | ICD-10-CM | POA: Diagnosis not present

## 2024-04-13 DIAGNOSIS — Z23 Encounter for immunization: Secondary | ICD-10-CM | POA: Diagnosis not present

## 2024-05-04 DIAGNOSIS — R7303 Prediabetes: Secondary | ICD-10-CM | POA: Diagnosis not present

## 2024-05-04 DIAGNOSIS — E669 Obesity, unspecified: Secondary | ICD-10-CM | POA: Diagnosis not present

## 2024-05-04 DIAGNOSIS — I10 Essential (primary) hypertension: Secondary | ICD-10-CM | POA: Diagnosis not present

## 2024-05-04 DIAGNOSIS — N183 Chronic kidney disease, stage 3 unspecified: Secondary | ICD-10-CM | POA: Diagnosis not present

## 2024-05-04 DIAGNOSIS — E782 Mixed hyperlipidemia: Secondary | ICD-10-CM | POA: Diagnosis not present

## 2024-05-04 DIAGNOSIS — N3281 Overactive bladder: Secondary | ICD-10-CM | POA: Diagnosis not present

## 2024-05-04 DIAGNOSIS — M810 Age-related osteoporosis without current pathological fracture: Secondary | ICD-10-CM | POA: Diagnosis not present

## 2024-05-04 DIAGNOSIS — K219 Gastro-esophageal reflux disease without esophagitis: Secondary | ICD-10-CM | POA: Diagnosis not present

## 2024-05-04 DIAGNOSIS — Z1331 Encounter for screening for depression: Secondary | ICD-10-CM | POA: Diagnosis not present

## 2024-05-04 DIAGNOSIS — Z0000 Encounter for general adult medical examination without abnormal findings: Secondary | ICD-10-CM | POA: Diagnosis not present

## 2024-05-04 DIAGNOSIS — F411 Generalized anxiety disorder: Secondary | ICD-10-CM | POA: Diagnosis not present

## 2024-05-04 DIAGNOSIS — M1990 Unspecified osteoarthritis, unspecified site: Secondary | ICD-10-CM | POA: Diagnosis not present
